# Patient Record
Sex: Female | Born: 2000 | Hispanic: Yes | Marital: Single | Smoking: Never smoker
Health system: Southern US, Community
[De-identification: ages and names within clinical notes are randomized; demographics above are authoritative.]

## PROBLEM LIST (undated history)

## (undated) ENCOUNTER — Inpatient Hospital Stay (HOSPITAL_COMMUNITY): Payer: Self-pay

## (undated) DIAGNOSIS — R011 Cardiac murmur, unspecified: Secondary | ICD-10-CM

## (undated) DIAGNOSIS — N83209 Unspecified ovarian cyst, unspecified side: Secondary | ICD-10-CM

## (undated) DIAGNOSIS — F32A Depression, unspecified: Secondary | ICD-10-CM

## (undated) DIAGNOSIS — R519 Headache, unspecified: Secondary | ICD-10-CM

## (undated) DIAGNOSIS — Z8759 Personal history of other complications of pregnancy, childbirth and the puerperium: Secondary | ICD-10-CM

## (undated) DIAGNOSIS — Z789 Other specified health status: Secondary | ICD-10-CM

## (undated) HISTORY — PX: KNEE SURGERY: SHX244

## (undated) HISTORY — DX: Personal history of other complications of pregnancy, childbirth and the puerperium: Z87.59

---

## 2020-10-28 HISTORY — PX: FEMUR FRACTURE SURGERY: SHX633

## 2020-10-31 HISTORY — PX: KNEE SURGERY: SHX244

## 2020-11-03 HISTORY — PX: DILATION AND CURETTAGE, DIAGNOSTIC / THERAPEUTIC: SUR384

## 2020-12-02 ENCOUNTER — Encounter (HOSPITAL_BASED_OUTPATIENT_CLINIC_OR_DEPARTMENT_OTHER): Payer: Self-pay | Admitting: Nurse Practitioner

## 2020-12-02 ENCOUNTER — Other Ambulatory Visit: Payer: Self-pay

## 2020-12-02 ENCOUNTER — Ambulatory Visit (INDEPENDENT_AMBULATORY_CARE_PROVIDER_SITE_OTHER): Payer: Self-pay | Admitting: Nurse Practitioner

## 2020-12-02 VITALS — BP 107/67 | HR 72 | Temp 97.6°F | Resp 12 | Ht 65.0 in | Wt 168.5 lb

## 2020-12-02 DIAGNOSIS — S82091H Other fracture of right patella, subsequent encounter for open fracture type I or II with delayed healing: Secondary | ICD-10-CM

## 2020-12-02 DIAGNOSIS — Z8759 Personal history of other complications of pregnancy, childbirth and the puerperium: Secondary | ICD-10-CM

## 2020-12-02 DIAGNOSIS — Z789 Other specified health status: Secondary | ICD-10-CM

## 2020-12-02 DIAGNOSIS — F32A Depression, unspecified: Secondary | ICD-10-CM

## 2020-12-02 DIAGNOSIS — Z7689 Persons encountering health services in other specified circumstances: Secondary | ICD-10-CM

## 2020-12-02 DIAGNOSIS — S72301D Unspecified fracture of shaft of right femur, subsequent encounter for closed fracture with routine healing: Secondary | ICD-10-CM

## 2020-12-02 DIAGNOSIS — F419 Anxiety disorder, unspecified: Secondary | ICD-10-CM

## 2020-12-02 DIAGNOSIS — Z603 Acculturation difficulty: Secondary | ICD-10-CM

## 2020-12-02 DIAGNOSIS — Z0000 Encounter for general adult medical examination without abnormal findings: Secondary | ICD-10-CM | POA: Insufficient documentation
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
## 2020-12-02 NOTE — Assessment & Plan Note (Addendum)
Newly immigrated from [place] with food, health, and employment insecurities noted. Significant needs for emotional and physical health also present with recent femur and patellar fracture and miscarriage of twin fetuses.  Appreciate social work help in finding resources for the patient during this transition period.  Notified that patient is not part of [facility] network- in process of finding other services that may help her during this time.

## 2020-12-02 NOTE — Assessment & Plan Note (Signed)
Spanish speaking patient with little to no [place] understanding. Interpreter services utilized today and will be needed for future encounters.

## 2020-12-02 NOTE — Progress Notes (Signed)
Social services and counseling requested

## 2020-12-02 NOTE — Patient Instructions (Addendum)
Recommendations from today's visit: . You will receive a call about the ultrasound and from the orthopedic doctor for your knee.  . You will receive a call from counseling services for your anxiety and depression symptoms . You will receive a call from social work to help with getting you settled.   You can take a medication called FAMOTIDINE (Pepcid) for your stomach. You take this one time a day. This can be purchased over the counter at the pharmacy.  Information on diet, exercise, and health maintenance recommendations are listed below. This is information to help you be sure you are on track for optimal health and monitoring.   Please look over this and let us know if you have any questions or if you have completed any of the health maintenance outside of [place] so that we can be sure your records are up to date.  ___________________________________________________________  Thank you for choosing [facility] for your Primary Care needs. I am excited for the opportunity to partner with you to meet your health care goals. It was a pleasure meeting you today!  I am an Adult-Geriatric Nurse Practitioner with a background in caring for patients for more than 20 years. I received my Paediatric nurse in Nursing and my Doctor of Nursing Practice degrees at [employer]. I received additional fellowship training in primary care and sports medicine after receiving my doctorate degree. I provide primary care and sports medicine services to patients age 71 and older within this office. I am also a provider with the [facility] and the director of the APP Fellowship with [facility].  I am a [place] native, but have called the [place] area home for nearly 20 years and am proud to be a member of this community.   I am passionate about providing the best service to you through preventive medicine and supportive care. I  consider you a part of the medical team and value your input. I work diligently to ensure that you are heard and your needs are met in a safe and effective manner. I want you to feel comfortable with me as your provider and want you to know that your health concerns are important to me.   For your information, our office hours are Monday- Friday 8:00 AM - 5:00 PM At this time I am not in the office on Wednesdays.  If you have questions or concerns, please call our office at [phone] or send [place] a MyChart message and we will respond as quickly as possible.   For all urgent or time sensitive needs we ask that you please call the office to avoid delays. MyChart is not constantly monitored and replies may take up to 72 business hours.  MyChart Policy: . MyChart allows for you to see your visit notes, after visit summary, provider recommendations, lab and tests results, make an appointment, request refills, and contact your provider or the office for non-urgent questions or concerns.  . Providers are seeing patients during normal business hours and do not have built in time to review MyChart messages. We ask that you allow a minimum of 72 business hours for MyChart message responses.  . Complex MyChart concerns may require a visit. Your provider may request you schedule a virtual or in person visit to ensure we are providing the best care possible. . MyChart messages sent after 4:00 PM on Friday will not be received by the provider until  Monday morning.    Lab and Test Results: . You will receive your lab and test results on MyChart as soon as they are completed and results have been sent by the lab or testing facility. Due to this service, you will receive your results BEFORE your provider.  . Please allow a minimum of 72 business hours for your provider to receive and review lab and test results and contact you about.   . Most lab and test result comments from the provider will be sent through  [place]. Your provider may recommend changes to the plan of care, follow-up visits, repeat testing, ask questions, or request an office visit to discuss these results. You may reply directly to this message or call the office at [phone] to provide information for the provider or set up an appointment. . In some instances, you will be called with test results and recommendations. Please let us know if this is preferred and we will make note of this in your chart to provide this for you.    . If you have not heard a response to your lab or test results in 72 business hours, please call the office to let us know.   After Hours: . For all non-emergency after hours needs, please call the office at [phone] and select the option to reach the on-call provider service. On-call services are shared between multiple [facility] offices and therefore it will not be possible to speak directly with your provider. On-call providers may provide medical advice and recommendations, but are unable to provide refills for maintenance medications.  . For all emergency or urgent medical needs after normal business hours, we recommend that you seek care at the closest Urgent Care or Emergency Department to ensure appropriate treatment in a timely manner.  [facility] has a 24 hour emergency room located on the ground floor for your convenience.    Please do not hesitate to reach out to [place] with concerns.   Thank you, again, for choosing me as your health care partner. I appreciate your trust and look forward to learning more about you.   Worthy Keeler, DNP, AGNP-c ___________________________________________________________  Health Maintenance Recommendations Screening Testing  Mammogram  Every 1 -2 years based on history and risk factors  Starting at age 69  Pap Smear  Ages 21-39 every 3 years  Ages 76-65 every 5 years with HPV testing  More frequent testing may be required  based on results and history  Colon Cancer Screening  Every 1-10 years based on test performed, risk factors, and history  Starting at age 44  Bone Density Screening  Every 2-10 years based on history  Starting at age 62 for women  Recommendations for men differ based on medication usage, history, and risk factors  AAA Screening  One time ultrasound  Men 48-37 years old who have every smoked  Lung Cancer Screening  Low Dose Lung CT every 12 months  Age 51-80 years with a 30 pack-year smoking history who still smoke or who have quit within the last 15 years  Screening Labs  Routine  Labs: Complete Blood Count (CBC), Complete Metabolic Panel (CMP), Cholesterol (Lipid Panel)  Every 6-12 months based on history and medications  May be recommended more frequently based on current conditions or previous results  Hemoglobin A1c Lab  Every 3-12 months based on history and previous results  Starting at age 79 or earlier with diagnosis of diabetes, high cholesterol, BMI >26, and/or  risk factors  Frequent monitoring for patients with diabetes to ensure blood sugar control  Thyroid Panel (TSH w/ T3 & T4)  Every 6 months based on history, symptoms, and risk factors  May be repeated more often if on medication  HIV  One time testing for all patients 47 and older  May be repeated more frequently for patients with increased risk factors or exposure  Hepatitis C  One time testing for all patients 58 and older  May be repeated more frequently for patients with increased risk factors or exposure  Gonorrhea, Chlamydia  Every 12 months for all sexually active persons 13-24 years  Additional monitoring may be recommended for those who are considered high risk or who have symptoms  PSA  Men 4-49 years old with risk factors  Additional screening may be recommended from age 33-69 based on risk factors, symptoms, and history  Vaccine Recommendations  Tetanus  Booster  All adults every 10 years  Flu Vaccine  All patients 6 months and older every year  COVID Vaccine  All patients 12 years and older  Initial dosing with booster  May recommend additional booster based on age and health history  HPV Vaccine  2 doses all patients age 28-26  Dosing may be considered for patients over 26  Shingles Vaccine (Shingrix)  2 doses all adults 72 years and older  Pneumonia (Pneumovax 23)  All adults 63 years and older  May recommend earlier dosing based on health history  Pneumonia (Prevnar 12)  All adults 43 years and older  Dosed 1 year after Pneumovax 23  Additional Screening, Testing, and Vaccinations may be recommended on an individualized basis based on family history, health history, risk factors, and/or exposure.  __________________________________________________________  Diet Recommendations for All Patients  I recommend that all patients maintain a diet low in saturated fats, carbohydrates, and cholesterol. While this can be challenging at first, it is not impossible and small changes can make big differences.  Things to try: [place] Kitchen Decreasing the amount of soda, sweet tea, and/or juice to one or less per day and replace with water o While water is always the first choice, if you do not like water you may consider - adding a water additive without sugar to improve the taste - other sugar free drinks . Replace potatoes with a brightly colored vegetable at dinner . Use healthy oils, such as canola oil or olive oil, instead of butter or hard margarine . Limit your bread intake to two pieces or less a day . Replace regular pasta with low carb pasta options . Bake, broil, or grill foods instead of frying . Monitor portion sizes  . Eat smaller, more frequent meals throughout the day instead of large meals  An important thing to remember is, if you love foods that are not great for your health, you don't have to give them up  completely. Instead, allow these foods to be a reward when you have done well. Allowing yourself to still have special treats every once in a while is a nice way to tell yourself thank you for working hard to keep yourself healthy.   Also remember that every day is a new day. If you have a bad day and "fall off the wagon", you can still climb right back up and keep moving along on your journey!  We have resources available to help you!  Some websites that may be helpful include: . www.http://carter.biz/  . Www.VeryWellFit.com _____________________________________________________________  Activity Recommendations for  All Patients  I recommend that all adults get at least 20 minutes of moderate physical activity that elevates your heart rate at least 5 days out of the week.  Some examples include: . Walking or jogging at a pace that allows you to carry on a conversation . Cycling (stationary bike or outdoors) . Water aerobics . Yoga . Weight lifting . Dancing If physical limitations prevent you from putting stress on your joints, exercise in a pool or seated in a chair are excellent options.  Do determine your MAXIMUM heart rate for activity: YOUR AGE - 220 = MAX HeartRate   Remember! . Do not push yourself too hard.  . Start slowly and build up your pace, speed, weight, time in exercise, etc.  . Allow your body to rest between exercise and get good sleep. . You will need more water than normal when you are exerting yourself. Do not wait until you are thirsty to drink. Drink with a purpose of getting in at least 8, 8 ounce glasses of water a day plus more depending on how much you exercise and sweat.    If you begin to develop dizziness, chest pain, abdominal pain, jaw pain, shortness of breath, headache, vision changes, lightheadedness, or other concerning symptoms, stop the activity and allow your body to rest. If your symptoms are severe, seek emergency evaluation immediately. If your  symptoms are concerning, but not severe, please let us know so that we can recommend further evaluation.   ________________________________________________________________

## 2020-12-02 NOTE — Assessment & Plan Note (Addendum)
Review of medical records from [place], [place]-  Reported miscarriage on 10/07/2020 with oral and vaginal medication taken in [place] to facilitate expelling retained products of conception- no f/u at that time Per medical records, HCG levels were found to be significantly elevated on 10/26/2020 Subsequent [place] 10/28/2020 revealed the presence of single gestational sac and 2 egg sacs with two small fetal poles with no fetal heart tones present consistent with failed Erol Flanagin pregnancy. Subsequent [place] 11/01/2020 revealed gestational sac no longer present with evidence suggestive of retained products of conception. Misprostol given and subsequent D&C performed 11/03/2020.  Per note- recommendations for follow-up HCG in 1 week.  Discrepancy between patients understanding and the recommendations. [place] ordered today for TV US based on patients perception of instructions.  Will notify patient to come in or repeat HCG testing.

## 2020-12-02 NOTE — Assessment & Plan Note (Addendum)
New patient to establish care Newly immigrated from [place] No medical history per patient- records from recent hospitalization reviewed- no other records available. Social work consult placed- will need help identifying resources available
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
## 2020-12-16 ENCOUNTER — Ambulatory Visit (HOSPITAL_BASED_OUTPATIENT_CLINIC_OR_DEPARTMENT_OTHER)
Admission: RE | Admit: 2020-12-16 | Discharge: 2020-12-16 | Disposition: A | Discharge status: Home / Self Care | Payer: Self-pay | Source: Ambulatory Visit | Attending: Nurse Practitioner | Admitting: Nurse Practitioner

## 2020-12-16 ENCOUNTER — Encounter: Payer: Self-pay | Admitting: Orthopedic Surgery

## 2020-12-16 ENCOUNTER — Other Ambulatory Visit: Payer: Self-pay

## 2020-12-16 ENCOUNTER — Ambulatory Visit: Payer: Self-pay

## 2020-12-16 ENCOUNTER — Ambulatory Visit (INDEPENDENT_AMBULATORY_CARE_PROVIDER_SITE_OTHER): Payer: Self-pay | Admitting: Orthopedic Surgery

## 2020-12-16 DIAGNOSIS — Z8759 Personal history of other complications of pregnancy, childbirth and the puerperium: Secondary | ICD-10-CM | POA: Insufficient documentation

## 2020-12-16 DIAGNOSIS — M25561 Pain in right knee: Secondary | ICD-10-CM

## 2020-12-16 NOTE — Progress Notes (Signed)
Post-Op Visit Note   Patient: Valerie Erickson           Date of Birth: April 10, 2001           MRN: [id number] Visit Date: 12/16/2020 PCP: Tollie Eth, NP   Assessment & Plan:  Chief Complaint:  Chief Complaint  Patient presents with   Right Knee - Injury   Visit Diagnoses:  1. Right knee pain, unspecified chronicity     Plan: Patient is a 20 year old female who presents s/p right patellar fracture ORIF with intramedullary nail of right femur for right femur fracture on 10/31/2020.  She is a resident of [place] but she was in [place] when she fell off of the wall and had to have surgery.  She was told to remain nonweightbearing and in knee immobilizer until she followed up with a provider in [place].  She currently is not working though she did move to [place] because she wants to find work.  She is living with her uncle aunt and cousin.  Her cousin is bilingual.  She has been nonweightbearing since her surgery date in early May.  Her dressings are still intact from procedure.  These dressings were broken down and removed.  Incision looks to be well-healed and staples were removed.  No sign of infection or dehiscence of the multiple incisions.  Okay to shower.  Radiographs were taken today that show hardware in good position with some early callus formation of the femur fracture.  There is some comminution of the inferior pole of the patella that looks to be potentially struggling to consolidate.  On exam she has incisions that are healing well.  No calf tenderness.  Negative Homans' sign.  She has no knee effusion.  ACL is stable on Lachman exam.  MCL and LCL are stable with no ligamentous laxity to varus/valgus stress.  She is able to perform straight leg raise, initially with about 20 to 30 degree extensor lag.  However after a little bit of extra effort, she is able to actively extend her knee to 0 degrees.  She does endorse pain at the inferior pole of the patella with range of  motion.  She extends to 0 degrees.  1 to 2 cm quadricep atrophy of the right lower extremity compared to the left.  Plan to transition from knee immobilizer to Bledsoe brace.  Prescription given with instructions to lock brace from 0 to 30 degrees.  She will start physical therapy upstairs to work on knee range of motion and especially quad strength.  She will start weightbearing as tolerated with crutches and the knee immobilizer/Bledsoe brace.  She was observed ambulating and seems to be adjusting to getting around with weightbearing okay.  In the meantime, before she starts physical therapy she will start straight leg raises with the knee immobilizer on.  Needs to work on quad strength before she is able to ambulate without crutches. Follow-up in 2 weeks for clinical re-check with Dr. August Saucer and with new x-ray check.   Follow-Up Instructions: No follow-ups on file.   Orders:  Orders Placed This Encounter  Procedures   XR Knee 1-2 Views Right   Ambulatory referral to Physical Therapy   No orders of the defined types were placed in this encounter.   Imaging: US Pelvic Complete With Transvaginal  Result Date: 12/16/2020 CLINICAL DATA:  Abortive treatment for pregnancy in May, confirming passage of products of conception and exclusion of retained products; LMP 12/02/2020 EXAM: TRANSABDOMINAL AND TRANSVAGINAL ULTRASOUND  OF PELVIS TECHNIQUE: Both transabdominal and transvaginal ultrasound examinations of the pelvis were performed. Transabdominal technique was performed for global imaging of the pelvis including uterus, ovaries, adnexal regions, and pelvic cul-de-sac. It was necessary to proceed with endovaginal exam following the transabdominal exam to visualize the uterus, endometrium, and ovaries. COMPARISON:  None FINDINGS: Uterus Measurements: 5.9 x 4.0 x 4.7 cm = volume: 58 mL. Retroverted. Normal morphology without mass Endometrium Thickness: 5 mm. No endometrial fluid, focal abnormality, or  evidence of retained products of conception. Right ovary Measurements: 2.7 x 1.2 x 3.2 cm = volume: 5.5 mL. Normal morphology without mass Left ovary Measurements: 3.1 x 1.8 x 1.9 cm = volume: 5.4 mL. Normal morphology without mass Other findings No free pelvic fluid.  No adnexal masses. IMPRESSION: Normal exam. Electronically Signed   By: Ulyses Southward M.D.   On: 12/16/2020 16:59    PMFS History: Patient Active Problem List   Diagnosis Date Noted   Encounter to establish care 12/02/2020   History of spontaneous abortion 12/02/2020   Anxiety and depression 12/02/2020   Patellar sleeve fracture of right knee, open type I or II, with delayed healing, subsequent encounter 12/02/2020   Need for follow-up by social worker 12/02/2020   Immigrant with language difficulty 12/02/2020   Closed fracture of shaft of right femur with routine healing 12/02/2020   Past Medical History:  Diagnosis Date   H/O one miscarriage     Family History  Problem Relation Age of Onset   Diabetes Father    Hypertension Father     Past Surgical History:  Procedure Laterality Date   DILATION AND CURETTAGE, DIAGNOSTIC / THERAPEUTIC  11/03/2020   FEMUR FRACTURE SURGERY Right 10/28/2020   KNEE SURGERY Right 10/31/2020   Social History   Occupational History   Not on file  Tobacco Use   Smoking status: Never   Smokeless tobacco: Never  Vaping Use   Vaping Use: Never used  Substance and Sexual Activity   Alcohol use: Never   Drug use: Never   Sexual activity: Not Currently    Birth control/protection: None

## 2020-12-16 NOTE — Progress Notes (Signed)
Please call patient- Interpreter services will be needed  Ultrasound shows a normal uterus and ovaries with no evidence of any tissue or retained pregnancy.  No need for any further testing at this time.

## 2020-12-24 ENCOUNTER — Telehealth (HOSPITAL_BASED_OUTPATIENT_CLINIC_OR_DEPARTMENT_OTHER): Payer: Self-pay

## 2020-12-24 NOTE — Telephone Encounter (Signed)
Left patient a message (using [employer] [phone]) to call back to discuss lab results.

## 2020-12-24 NOTE — Telephone Encounter (Signed)
-----   Message from Tollie Eth, NP sent at 12/16/2020  7:43 PM EDT ----- Please call patient- Interpreter services will be needed  Ultrasound shows a normal uterus and ovaries with no evidence of any tissue or retained pregnancy.  No need for any further testing at this time.

## 2020-12-31 ENCOUNTER — Telehealth (HOSPITAL_BASED_OUTPATIENT_CLINIC_OR_DEPARTMENT_OTHER): Payer: Self-pay

## 2020-12-31 NOTE — Telephone Encounter (Signed)
Called [employer] to interpret [place] [phone]).  Called and spoke to [facility] on file [address]  Made her aware of results and she will inform Dayja.  Instructed her to contact the office with questions or concerns.

## 2020-12-31 NOTE — Telephone Encounter (Signed)
-----   Message from Sara E Early, NP sent at 12/16/2020  7:43 PM EDT ----- Please call patient- Interpreter services will be needed  Ultrasound shows a normal uterus and ovaries with no evidence of any tissue or retained pregnancy.  No need for any further testing at this time.  

## 2021-01-01 ENCOUNTER — Encounter: Payer: Self-pay | Admitting: Orthopedic Surgery

## 2021-01-01 ENCOUNTER — Ambulatory Visit (INDEPENDENT_AMBULATORY_CARE_PROVIDER_SITE_OTHER): Payer: Self-pay | Admitting: Orthopedic Surgery

## 2021-01-01 ENCOUNTER — Ambulatory Visit: Payer: Self-pay

## 2021-01-01 DIAGNOSIS — M25561 Pain in right knee: Secondary | ICD-10-CM

## 2021-01-01 NOTE — Progress Notes (Signed)
Office Visit Note   Patient: Valerie Erickson           Date of Birth: 2000/12/10           MRN: [id number] Visit Date: 01/01/2021 Requested by: Tollie Eth, NP [address] [place],  [place] [address] PCP: Tollie Eth, NP  Subjective: Chief Complaint  Patient presents with   Right Knee - Follow-up    HPI: Patient presents with a 72-month out right patella open reduction internal fixation.  She has been doing partial weightbearing in a Bledsoe brace 0 to 30 degrees.                ROS: All systems reviewed are negative as they relate to the chief complaint within the history of present illness.  Patient denies  fevers or chills.   Assessment & Plan: Visit Diagnoses:  1. Right knee pain, unspecified chronicity     Plan: Impression is healed femur fracture with healing patella fracture with comminution of the inferior pole.  Plan is 0-45 and the bedside weightbearing as tolerated without crutches.  2-week return to increase the Bledsoe brace to 60 degrees at that time.  Flat ground only weightbearing with no stairs. Follow-Up Instructions: Return in about 2 weeks (around 01/15/2021).   Orders:  Orders Placed This Encounter  Procedures   XR FEMUR, MIN 2 VIEWS RIGHT   No orders of the defined types were placed in this encounter.     Procedures: No procedures performed   Clinical Data: No additional findings.  Objective: Vital Signs: LMP  (LMP Unknown) Comment: Recent D&C  Physical Exam:   Constitutional: Patient appears well-developed HEENT:  Head: Normocephalic Eyes:EOM are normal Neck: Normal range of motion Cardiovascular: Normal rate Pulmonary/chest: Effort normal Neurologic: Patient is alert Skin: Skin is warm Psychiatric: Patient has normal mood and affect   Ortho Exam: Ortho exam demonstrates ability to do 10 straight leg raises.  Extensor mechanism feels intact.  Incision intact.  No knee effusion.  No calf tenderness negative  Homans.  Range of motion is to about 60 degrees passively.  20 degree extensor lag but she can get the leg straight  Specialty Comments:  No specialty comments available.  Imaging: No results found.   PMFS History: Patient Active Problem List   Diagnosis Date Noted   Encounter to establish care 12/02/2020   History of spontaneous abortion 12/02/2020   Anxiety and depression 12/02/2020   Patellar sleeve fracture of right knee, open type I or II, with delayed healing, subsequent encounter 12/02/2020   Need for follow-up by social worker 12/02/2020   Immigrant with language difficulty 12/02/2020   Closed fracture of shaft of right femur with routine healing 12/02/2020   Past Medical History:  Diagnosis Date   H/O one miscarriage     Family History  Problem Relation Age of Onset   Diabetes Father    Hypertension Father     Past Surgical History:  Procedure Laterality Date   DILATION AND CURETTAGE, DIAGNOSTIC / THERAPEUTIC  11/03/2020   FEMUR FRACTURE SURGERY Right 10/28/2020   KNEE SURGERY Right 10/31/2020   Social History   Occupational History   Not on file  Tobacco Use   Smoking status: Never   Smokeless tobacco: Never  Vaping Use   Vaping Use: Never used  Substance and Sexual Activity   Alcohol use: Never   Drug use: Never   Sexual activity: Not Currently    Birth control/protection: None

## 2021-01-20 ENCOUNTER — Other Ambulatory Visit: Payer: Self-pay

## 2021-01-20 ENCOUNTER — Ambulatory Visit (INDEPENDENT_AMBULATORY_CARE_PROVIDER_SITE_OTHER): Payer: Self-pay | Admitting: Orthopedic Surgery

## 2021-01-20 DIAGNOSIS — M25561 Pain in right knee: Secondary | ICD-10-CM

## 2021-01-20 DIAGNOSIS — R0602 Shortness of breath: Secondary | ICD-10-CM

## 2021-01-20 NOTE — Progress Notes (Signed)
Post-Op Visit Note   Patient: Valerie Erickson           Date of Birth: 02/02/2001           MRN: [id number] Visit Date: 01/20/2021 PCP: Tollie Eth, NP   Assessment & Plan:  Chief Complaint:  Chief Complaint  Patient presents with   Right Knee - Follow-up   Visit Diagnoses:  1. Right knee pain, unspecified chronicity   2. Shortness of breath     Plan: Patient is a 20 year old female who returns s/p right patellar fracture ORIF with intramedullary nail of right femur for right femur fracture on 10/31/2020 in [place].  She is currently ambulating full weightbearing with Bledsoe brace and she has discontinued crutches as she states she feels a lot more stable when walking.  She is doing physical therapy exercises at home.  She has Bledsoe brace that is set from 0 to 45 degrees.  She states that her leg feels like it is getting stronger and stronger and she has less difficulty lifting her leg that she has in the past.  On exam today, incision over the anterior knee is healing well with no evidence of infection or dehiscence.  She has no effusion.  She has range of motion from 0 to 70 degrees passively.  Moderate calf tenderness distally with weakly positive Homans' sign.  She is able to perform straight leg raise with about 15 degree extensor lag.  She is able to maintain her leg completely straight at 0 degrees when the leg is placed at 0 degrees and support is taken away from the heel.  She does have continued tenderness over the inferior portion of the patella.  She slipped yesterday and had increased pain but today her pain is back to baseline.  One of her main complaint today is endorsement of 1 week of shortness of breath without chest pain.  She states that while talking on the phone she starts "running out of air".  She likens it to heavy exercise but after taking a break, she returned back to normal.  She has never had any symptoms like this before.  She denies any family history of  DVT/PE though she is unaware what a blood clot is exactly.  She has heart rate of 88 on examination.  Wells criteria gives a score of 6 suggesting fairly high likelihood of pulmonary embolus.  Plan to order CTA of chest for further evaluation with ultrasound of lower extremities to rule out DVT.  Follow-Up Instructions: No follow-ups on file.   Orders:  Orders Placed This Encounter  Procedures   CT Angio Chest Pulmonary Embolism (PE) W or WO Contrast   VAS [place] LOWER EXTREMITY VENOUS (DVT)   No orders of the defined types were placed in this encounter.   Imaging: No results found.  PMFS History: Patient Active Problem List   Diagnosis Date Noted   Encounter to establish care 12/02/2020   History of spontaneous abortion 12/02/2020   Anxiety and depression 12/02/2020   Patellar sleeve fracture of right knee, open type I or II, with delayed healing, subsequent encounter 12/02/2020   Need for follow-up by social worker 12/02/2020   Immigrant with language difficulty 12/02/2020   Closed fracture of shaft of right femur with routine healing 12/02/2020   Past Medical History:  Diagnosis Date   H/O one miscarriage     Family History  Problem Relation Age of Onset   Diabetes Father    Hypertension Father  Past Surgical History:  Procedure Laterality Date   DILATION AND CURETTAGE, DIAGNOSTIC / THERAPEUTIC  11/03/2020   FEMUR FRACTURE SURGERY Right 10/28/2020   KNEE SURGERY Right 10/31/2020   Social History   Occupational History   Not on file  Tobacco Use   Smoking status: Never   Smokeless tobacco: Never  Vaping Use   Vaping Use: Never used  Substance and Sexual Activity   Alcohol use: Never   Drug use: Never   Sexual activity: Not Currently    Birth control/protection: None

## 2021-01-21 ENCOUNTER — Ambulatory Visit (HOSPITAL_COMMUNITY): Admission: RE | Admit: 2021-01-21 | Payer: Self-pay | Source: Ambulatory Visit

## 2021-01-22 ENCOUNTER — Encounter (HOSPITAL_BASED_OUTPATIENT_CLINIC_OR_DEPARTMENT_OTHER): Payer: Self-pay

## 2021-01-22 ENCOUNTER — Other Ambulatory Visit: Payer: Self-pay

## 2021-01-22 ENCOUNTER — Ambulatory Visit (HOSPITAL_BASED_OUTPATIENT_CLINIC_OR_DEPARTMENT_OTHER)
Admission: RE | Admit: 2021-01-22 | Discharge: 2021-01-22 | Disposition: A | Discharge status: Home / Self Care | Payer: Self-pay | Source: Ambulatory Visit | Attending: Orthopedic Surgery | Admitting: Orthopedic Surgery

## 2021-01-22 DIAGNOSIS — R0602 Shortness of breath: Secondary | ICD-10-CM | POA: Insufficient documentation

## 2021-01-22 MED ORDER — IOHEXOL 350 MG/ML SOLN
75.0000 mL | Freq: Once | INTRAVENOUS | Status: AC | PRN
  Administered 2021-01-22: 75 mL via INTRAVENOUS

## 2021-01-23 ENCOUNTER — Encounter: Payer: Self-pay | Admitting: Orthopedic Surgery

## 2021-02-03 ENCOUNTER — Other Ambulatory Visit: Payer: Self-pay

## 2021-02-03 ENCOUNTER — Ambulatory Visit (INDEPENDENT_AMBULATORY_CARE_PROVIDER_SITE_OTHER): Payer: Self-pay

## 2021-02-03 ENCOUNTER — Ambulatory Visit (INDEPENDENT_AMBULATORY_CARE_PROVIDER_SITE_OTHER): Payer: Self-pay | Admitting: Nurse Practitioner

## 2021-02-03 ENCOUNTER — Other Ambulatory Visit (HOSPITAL_BASED_OUTPATIENT_CLINIC_OR_DEPARTMENT_OTHER): Payer: Self-pay

## 2021-02-03 ENCOUNTER — Ambulatory Visit (INDEPENDENT_AMBULATORY_CARE_PROVIDER_SITE_OTHER): Payer: Self-pay | Admitting: Orthopedic Surgery

## 2021-02-03 ENCOUNTER — Encounter (HOSPITAL_BASED_OUTPATIENT_CLINIC_OR_DEPARTMENT_OTHER): Payer: Self-pay | Admitting: Nurse Practitioner

## 2021-02-03 VITALS — BP 109/64 | HR 75 | Ht 65.0 in | Wt 178.4 lb

## 2021-02-03 DIAGNOSIS — F419 Anxiety disorder, unspecified: Secondary | ICD-10-CM

## 2021-02-03 DIAGNOSIS — F32A Depression, unspecified: Secondary | ICD-10-CM

## 2021-02-03 DIAGNOSIS — Z0000 Encounter for general adult medical examination without abnormal findings: Secondary | ICD-10-CM

## 2021-02-03 DIAGNOSIS — S82091H Other fracture of right patella, subsequent encounter for open fracture type I or II with delayed healing: Secondary | ICD-10-CM

## 2021-02-03 DIAGNOSIS — M79604 Pain in right leg: Secondary | ICD-10-CM

## 2021-02-03 DIAGNOSIS — F411 Generalized anxiety disorder: Secondary | ICD-10-CM

## 2021-02-03 DIAGNOSIS — K219 Gastro-esophageal reflux disease without esophagitis: Secondary | ICD-10-CM

## 2021-02-03 DIAGNOSIS — R0602 Shortness of breath: Secondary | ICD-10-CM | POA: Insufficient documentation

## 2021-02-03 MED ORDER — PANTOPRAZOLE SODIUM 40 MG PO TBEC
40.0000 mg | DELAYED_RELEASE_TABLET | Freq: Every day | ORAL | 3 refills | Status: DC
  Filled 2021-02-03: qty 30, 30d supply, fill #0

## 2021-02-03 NOTE — Assessment & Plan Note (Signed)
Symptoms appear to be provoked by anxiety- which is understandable given all that she has been through over the past several months.  CT ordered by ortho shows no sign of PE or cardiopulmonary etiology- reviewed in office today Discussed medication and counseling options  She would like to avoid medications at this time, but is open to counseling. Referral placed for counseling with request for spanish speaking provider  Will check labs today to ensure that no signs of anemia or other concerning factors are present that could worsen symptoms. Appreciate counseling input on medication and further treatment options

## 2021-02-03 NOTE — Assessment & Plan Note (Signed)
CPE today No pap today, but will plan in one year Labs today Mood a concerning finding, otherwise appears healthy Recommend follow-up in 1 year for CPE or sooner if needed

## 2021-02-03 NOTE — Patient Instructions (Addendum)
Revisaremos su sangre para observar sus recuentos de hierro en su sangre y asegurarnos de que su tiroides est funcionando correctamente. Ambos pueden causar algunos de los sntomas de dificultad para respirar, fatiga y ansiedad que est teniendo. We will check your blood to look at your iron counts in your blood and make sure that your thyroid is working properly. Both of these can cause some of the shortness of breath, fatigue, and anxiety symptoms that you are having.   La tomografa computarizada no mostr ningn problema con los pulmones ni ningn signo de un cogulo de [place], por lo que es una buena noticia. The CT scan did not show any problems with your lungs or any signs of a blood clot, so that is good news.   Cuando ha estado levantado y moviendo la rodilla puede hincharse y doler ms. Puede poner una bolsa de hielo en la rodilla durante 20 minutos para ayudar con el dolor y la hinchazn.  When you have been up and moving your knee may swell and hurt more. You can put an ice pack on the knee for 20 minutes to help with the pain and swelling.   He enviado una referencia a un consejero para hablar con usted [employer] de humor que est teniendo. Esto es muy comn con Neomia Dear transicin y Neomia Dear lesin tan grandes: has pasado por mucho. Creo que esto ser til.  I have sent a referral to a counselor to talk with you about the mood changes you are having. This is very common with such a big transition and injury- you have been through a lot. I think this will be helpful.   He enviado un medicamento llamado pantoprazol a la farmacia de abajo para ayudar con el dolor que tiene en el [place]. Este dolor se debe a la irritacin y Financial risk analyst cuando se [place] ibuprofeno para Chief Technology Officer durante un largo perodo de [place]. I have sent a medicine called pantoprazole to the pharmacy downstairs to help with the pain you are having in your stomach. This pain is from irritation and can get worse when using  ibuprofen for pain for a long period of time.

## 2021-02-03 NOTE — Assessment & Plan Note (Signed)
Increased ibuprofen use with recent injury Tenderness present to epigastric region with endorsement of "burning" since starting ibuprofen Given the nature of presentation- recommend pantoprazole to help with suspected GERD symptoms, especially while taking ibuprofen.  No alarm sx present today.  F/U if sx worsen or fail to improve.
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
## 2021-02-03 NOTE — Assessment & Plan Note (Signed)
See anxiety

## 2021-02-03 NOTE — Assessment & Plan Note (Signed)
Positive anxiety screening with symptoms of palpitations and shortness of breath reported Will obtain labs today to determine if other etiology could be exacerbating her condition Very likely the significant life changes and events in the past several months have triggered her symptoms. Referral to counseling with spanish speaking provider requested She is not interested in medication at this time Will follow for further recommendations

## 2021-02-03 NOTE — Assessment & Plan Note (Signed)
Continues with ortho and therapy Improved symptoms and ROM present Recommend ice 20 minutes at a time 3 times a day for swelling with increased activity F/U with ortho if sx worsen or fail to improve.

## 2021-02-04 LAB — CBC WITH DIFFERENTIAL/PLATELET
Basophils Absolute: 0 10*3/uL (ref 0.0–0.2)
Basos: 1 %
EOS (ABSOLUTE): 0.2 10*3/uL (ref 0.0–0.4)
Eos: 2 %
Hematocrit: 43.9 % (ref 34.0–46.6)
Hemoglobin: 14.7 g/dL (ref 11.1–15.9)
Immature Grans (Abs): 0.1 10*3/uL (ref 0.0–0.1)
Immature Granulocytes: 1 %
Lymphocytes Absolute: 1.5 10*3/uL (ref 0.7–3.1)
Lymphs: 18 %
MCH: 30.8 pg (ref 26.6–33.0)
MCHC: 33.5 g/dL (ref 31.5–35.7)
MCV: 92 fL (ref 79–97)
Monocytes Absolute: 0.6 10*3/uL (ref 0.1–0.9)
Monocytes: 7 %
Neutrophils Absolute: 5.8 10*3/uL (ref 1.4–7.0)
Neutrophils: 71 %
Platelets: 307 10*3/uL (ref 150–450)
RBC: 4.77 x10E6/uL (ref 3.77–5.28)
RDW: 12.7 % (ref 11.7–15.4)
WBC: 8.1 10*3/uL (ref 3.4–10.8)

## 2021-02-04 LAB — COMPREHENSIVE METABOLIC PANEL
ALT: 32 IU/L (ref 0–32)
AST: 21 IU/L (ref 0–40)
Albumin/Globulin Ratio: 1.7 (ref 1.2–2.2)
Albumin: 4.6 g/dL (ref 3.9–5.0)
Alkaline Phosphatase: 117 IU/L — ABNORMAL HIGH (ref 42–106)
BUN/Creatinine Ratio: 21 (ref 9–23)
BUN: 10 mg/dL (ref 6–20)
Bilirubin Total: 0.2 mg/dL (ref 0.0–1.2)
CO2: 21 mmol/L (ref 20–29)
Calcium: 9.5 mg/dL (ref 8.7–10.2)
Chloride: 105 mmol/L (ref 96–106)
Creatinine, Ser: 0.48 mg/dL — ABNORMAL LOW (ref 0.57–1.00)
Globulin, Total: 2.7 g/dL (ref 1.5–4.5)
Glucose: 94 mg/dL (ref 65–99)
Potassium: 4.4 mmol/L (ref 3.5–5.2)
Sodium: 141 mmol/L (ref 134–144)
Total Protein: 7.3 g/dL (ref 6.0–8.5)
eGFR: 139 mL/min/{1.73_m2} (ref >59)

## 2021-02-04 LAB — TSH: TSH: 2.25 u[IU]/mL (ref 0.450–4.500)

## 2021-02-04 LAB — VITAMIN D 25 HYDROXY (VIT D DEFICIENCY, FRACTURES): Vit D, 25-Hydroxy: 28.1 ng/mL — ABNORMAL LOW (ref 30.0–100.0)

## 2021-02-05 NOTE — Progress Notes (Signed)
Please call patient with translator- Spanish  Labs look good overall. The vitamin D levels are low. I recommend a vitamin D supplement to help with this. Vitamin D3 [phone] iU per day. No other changes or concerns.

## 2021-02-06 ENCOUNTER — Telehealth (HOSPITAL_BASED_OUTPATIENT_CLINIC_OR_DEPARTMENT_OTHER): Payer: Self-pay

## 2021-02-06 NOTE — Telephone Encounter (Signed)
Patient needs interpreter. [employer] [phone]) Attempted to call patient to discuss lab results.   Left voicemail for patient to call back.

## 2021-02-06 NOTE — Telephone Encounter (Signed)
-----   Message from Sara E Early, NP sent at 02/05/2021  8:02 PM EDT ----- Please call patient with translator- Spanish  Labs look good overall. The vitamin D levels are low. I recommend a vitamin D supplement to help with this. Vitamin D3 [phone] iU per day. No other changes or concerns.  

## 2021-02-11 ENCOUNTER — Telehealth (HOSPITAL_BASED_OUTPATIENT_CLINIC_OR_DEPARTMENT_OTHER): Payer: Self-pay

## 2021-02-11 NOTE — Telephone Encounter (Signed)
-----   Message from Tollie Eth, NP sent at 02/05/2021  8:02 PM EDT ----- Please call patient with translator- Spanish  Labs look good overall. The vitamin D levels are low. I recommend a vitamin D supplement to help with this. Vitamin D3 [phone] iU per day. No other changes or concerns.

## 2021-02-11 NOTE — Telephone Encounter (Signed)
Per DPR spoke with patient's cousin to discuss lab results. She is aware and agreeable to plan. She was instructed to contact the office with questions and concerns.

## 2021-02-17 ENCOUNTER — Encounter: Payer: Self-pay | Admitting: Orthopedic Surgery

## 2021-02-17 ENCOUNTER — Ambulatory Visit
Admission: RE | Admit: 2021-02-17 | Discharge: 2021-02-17 | Disposition: A | Discharge status: Home / Self Care | Payer: Self-pay | Source: Ambulatory Visit | Attending: Surgical | Admitting: Surgical

## 2021-02-17 DIAGNOSIS — M79604 Pain in right leg: Secondary | ICD-10-CM

## 2021-02-17 NOTE — Progress Notes (Signed)
   Post-Op Visit Note   Patient: Valerie Erickson           Date of Birth: 2001-02-18           MRN: [id number] Visit Date: 02/03/2021 PCP: Tollie Eth, NP   Assessment & Plan:  Chief Complaint:  Chief Complaint  Patient presents with   Right Leg - Routine Post Op   Visit Diagnoses:  1. Pain in right leg     Plan: Patient is a 20 year old female who is s/p right patella fracture ORIF and right femur intramedullary nail on 11/01/10 at outside facility.  She has been doing well overall and feels like she is progressing until she began to have severe pain as of 2 nights ago without any new injury.  She does find it more difficult to lift her leg and extend her knee but she is able to do this.  She had her vitamin D checked by her primary care provider today which is a good idea to check.  On exam she has 0 degrees extension and 95 degrees of knee flexion.  She actively extends to about 10 degrees.  Able to perform straight leg raise with 10 degree extensor lag.  No effusion noted.  Tenderness diffusely through the anterior right knee but mostly over the patella anteriorly.  No pain with hip range of motion.  Radiographs of the right leg today show continued fragmentation without significantly increased consolidation of the inferior pole of the patella.  Femoral shaft fracture seems to be healing well.  With patient's increased pain as well as the lack of progressive fracture consolidation, plan to order CT of the right knee to evaluate for nonunion.  Follow-Up Instructions: No follow-ups on file.   Orders:  Orders Placed This Encounter  Procedures   XR FEMUR, MIN 2 VIEWS RIGHT   CT KNEE RIGHT WO CONTRAST   No orders of the defined types were placed in this encounter.   Imaging: No results found.  PMFS History: Patient Active Problem List   Diagnosis Date Noted   Shortness of breath 02/03/2021   Generalized anxiety disorder 02/03/2021   Gastroesophageal reflux disease  02/03/2021   Encounter for annual physical exam 12/02/2020   History of spontaneous abortion 12/02/2020   Anxiety and depression 12/02/2020   Patellar sleeve fracture of right knee, open type I or II, with delayed healing, subsequent encounter 12/02/2020   Need for follow-up by social worker 12/02/2020   Immigrant with language difficulty 12/02/2020   Closed fracture of shaft of right femur with routine healing 12/02/2020   Past Medical History:  Diagnosis Date   H/O one miscarriage     Family History  Problem Relation Age of Onset   Diabetes Father    Hypertension Father     Past Surgical History:  Procedure Laterality Date   DILATION AND CURETTAGE, DIAGNOSTIC / THERAPEUTIC  11/03/2020   FEMUR FRACTURE SURGERY Right 10/28/2020   KNEE SURGERY Right 10/31/2020   Social History   Occupational History   Not on file  Tobacco Use   Smoking status: Never   Smokeless tobacco: Never  Vaping Use   Vaping Use: Never used  Substance and Sexual Activity   Alcohol use: Never   Drug use: Never   Sexual activity: Not Currently    Birth control/protection: None

## 2021-04-23 ENCOUNTER — Ambulatory Visit: Payer: No Typology Code available for payment source | Admitting: Orthopedic Surgery

## 2021-08-18 ENCOUNTER — Ambulatory Visit (INDEPENDENT_AMBULATORY_CARE_PROVIDER_SITE_OTHER): Payer: Self-pay

## 2021-08-18 ENCOUNTER — Encounter: Payer: Self-pay | Admitting: Orthopedic Surgery

## 2021-08-18 ENCOUNTER — Other Ambulatory Visit: Payer: Self-pay

## 2021-08-18 ENCOUNTER — Ambulatory Visit (INDEPENDENT_AMBULATORY_CARE_PROVIDER_SITE_OTHER): Payer: Self-pay | Admitting: Orthopedic Surgery

## 2021-08-18 DIAGNOSIS — M25561 Pain in right knee: Secondary | ICD-10-CM

## 2021-08-18 NOTE — Progress Notes (Signed)
Office Visit Note   Patient: Valerie Erickson           Date of Birth: [birth year]           MRN: [id number] Visit Date: 08/18/2021 Requested by: Tollie Eth, NP [id number] [place],  [place] [address] PCP: Tollie Eth, NP  Subjective: Chief Complaint  Patient presents with   Right Knee - Follow-up    HPI: Patient presents for evaluation of the right knee.  She fell while she was cleaning last week.  She is wearing a sleeve.  Has a history of femur fracture fixation and patella fracture fixation for open fracture done last year.  She is concerned about hardware failure.  This was a hyperflexion type injury and impact type injury.  She has been taking some over-the-counter medication.  Currently not employed and not in school.  Reports some catching sensations.              ROS: All systems reviewed are negative as they relate to the chief complaint within the history of present illness.  Patient denies  fevers or chills.   Assessment & Plan: Visit Diagnoses:  1. Right knee pain, unspecified chronicity     Plan: Impression is right knee pain with no change in hardware position.  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  Could consider operative removal of that inferior pole with reimplantation of the patellar tendon.  Nonetheless I do not think it is bothering her that much for that type of intervention.  Do not see really any change in hardware position and that is primarily what she was trying to determine.  She will follow-up with [place] as needed.  Follow-Up Instructions: No follow-ups on file.   Orders:  Orders Placed This Encounter  Procedures   XR KNEE 3 VIEW RIGHT   No orders of the defined types were placed in this encounter.     Procedures: No procedures performed   Clinical Data: No additional findings.  Objective: Vital Signs: There were no vitals taken for this visit.  Physical Exam:    Constitutional: Patient appears well-developed HEENT:  Head: Normocephalic Eyes:EOM are normal Neck: Normal range of motion Cardiovascular: Normal rate Pulmonary/chest: Effort normal Neurologic: Patient is alert Skin: Skin is warm Psychiatric: Patient has normal mood and affect   Ortho Exam: Ortho exam demonstrates slightly antalgic gait to the right.  No effusion in the right knee.  Her extensor mechanism is functional but somewhat tender particular at the inferior pole of the patella.  No change in expected amount of patellofemoral crepitus present.  Incision intact.  Specialty Comments:  No specialty comments available.  Imaging: No results found.   PMFS History: Patient Active Problem List   Diagnosis Date Noted   Shortness of breath 02/03/2021   Generalized anxiety disorder 02/03/2021   Gastroesophageal reflux disease 02/03/2021   Encounter for annual physical exam 12/02/2020   History of spontaneous abortion 12/02/2020   Anxiety and depression 12/02/2020   Patellar sleeve fracture of right knee, open type I or II, with delayed healing, subsequent encounter 12/02/2020   Need for follow-up by social worker 12/02/2020   Immigrant with language difficulty 12/02/2020   Closed fracture of shaft of right femur with routine healing 12/02/2020   Past Medical History:  Diagnosis Date   H/O one miscarriage     Family History  Problem Relation Age of Onset   Diabetes Father  Hypertension Father     Past Surgical History:  Procedure Laterality Date   DILATION AND CURETTAGE, DIAGNOSTIC / THERAPEUTIC  11/03/2020   FEMUR FRACTURE SURGERY Right 10/28/2020   KNEE SURGERY Right 10/31/2020   Social History   Occupational History   Not on file  Tobacco Use   Smoking status: Never   Smokeless tobacco: Never  Vaping Use   Vaping Use: Never used  Substance and Sexual Activity   Alcohol use: Never   Drug use: Never   Sexual activity: Not Currently    Birth  control/protection: None

## 2021-10-14 IMAGING — CT CT ANGIO CHEST
3 of 8 series · 13 of 36 positions shown · IV contrast (omnipaque)
Comparison: None.

CLINICAL DATA: Shortness of breath post knee surgery.

EXAM:
CT ANGIOGRAPHY CHEST WITH CONTRAST
TECHNIQUE: Multidetector CT imaging of the chest was performed using the
standard protocol during bolus administration of intravenous
contrast. Multiplanar CT image reconstructions and MIPs were
obtained to evaluate the vascular anatomy.
CONTRAST:  75mL OMNIPAQUE IOHEXOL 350 MG/ML SOLN

[Series 4: pe soft · axial · 0.69mm/px · z∈[+1334,+1474]mm · 3 of 140 slices shown]
[im 35/140  lung]
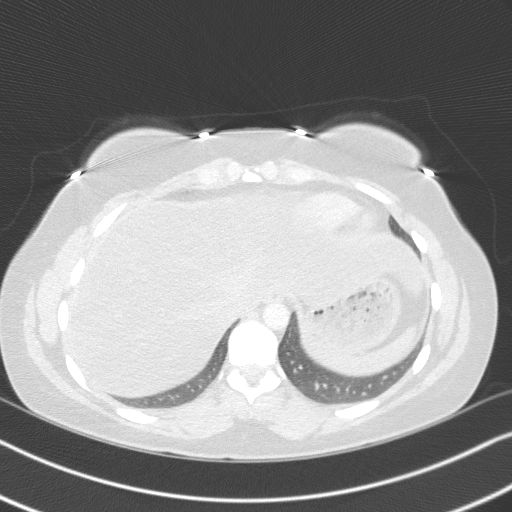
[im 70/140  lung]
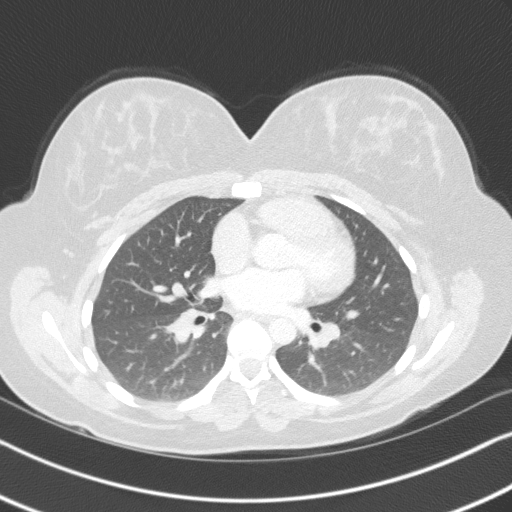
[im 105/140  lung]
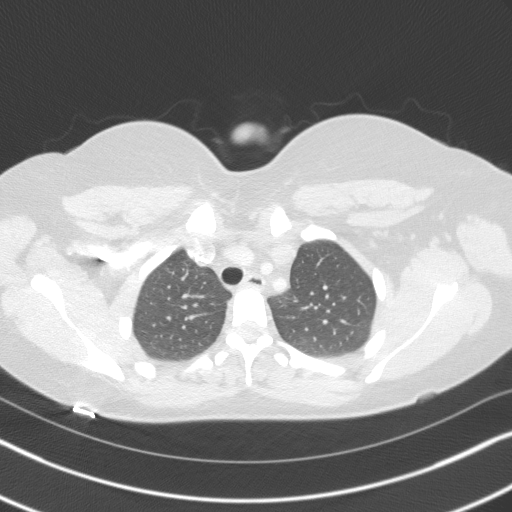

[Series 8: pe axial thins · axial · 0.81mm/px · z∈[+1289,+1505]mm · 9 of 270 slices shown]
[im 27/270  lung]
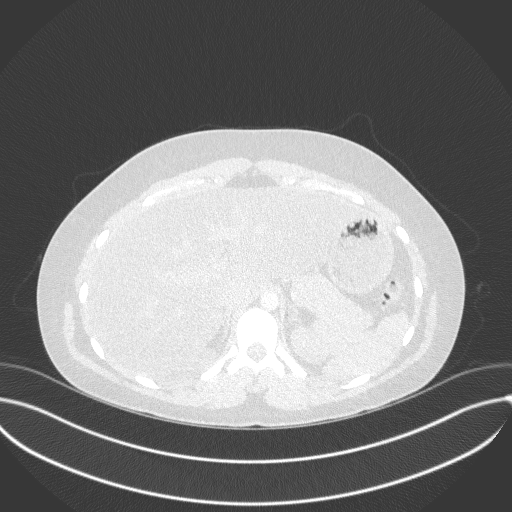
[im 54/270  mediastinal]
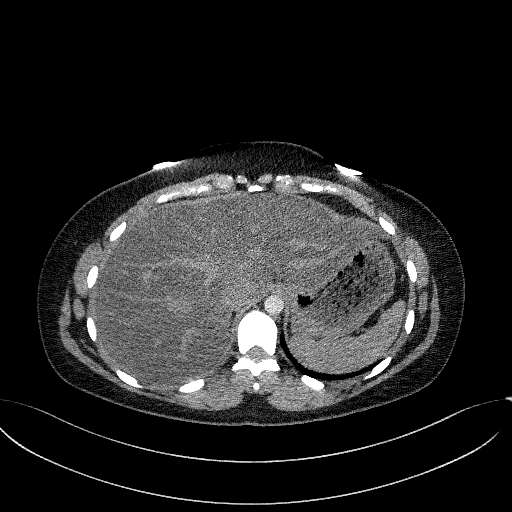
[im 81/270  lung]
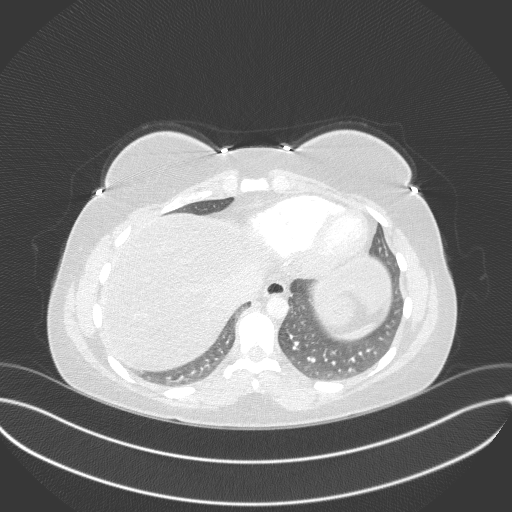
[im 108/270  mediastinal]
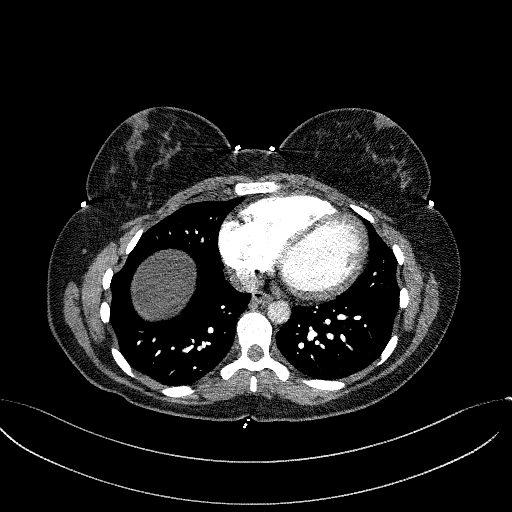
[im 135/270  lung]
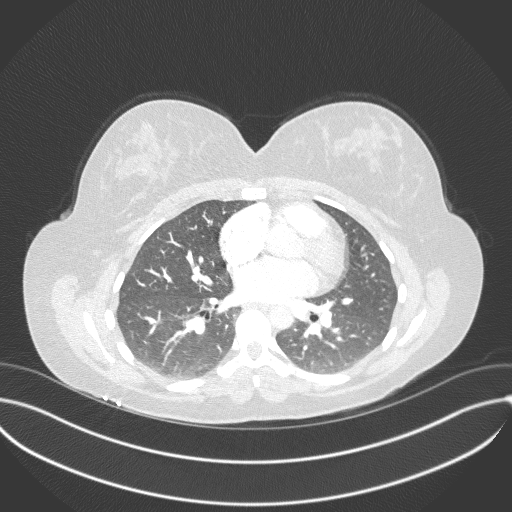
[im 162/270  mediastinal]
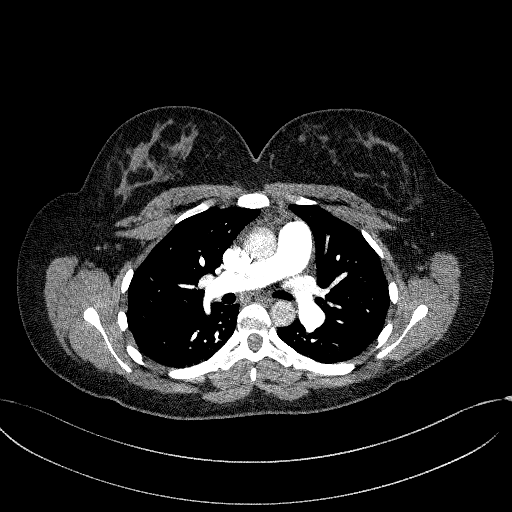
[im 189/270  lung]
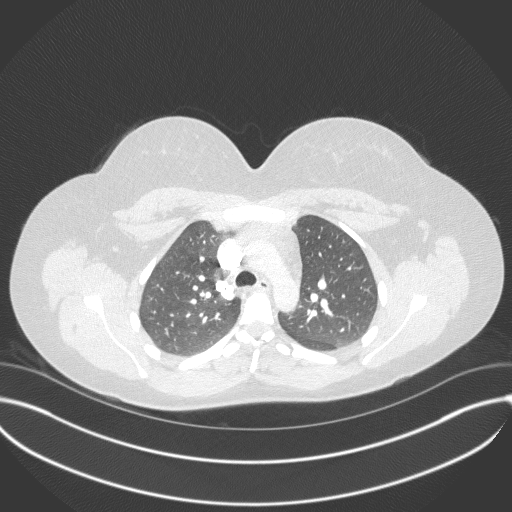
[im 216/270  mediastinal]
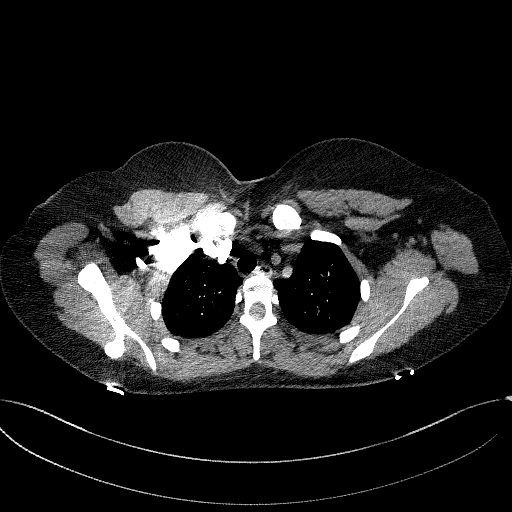
[im 243/270  lung]
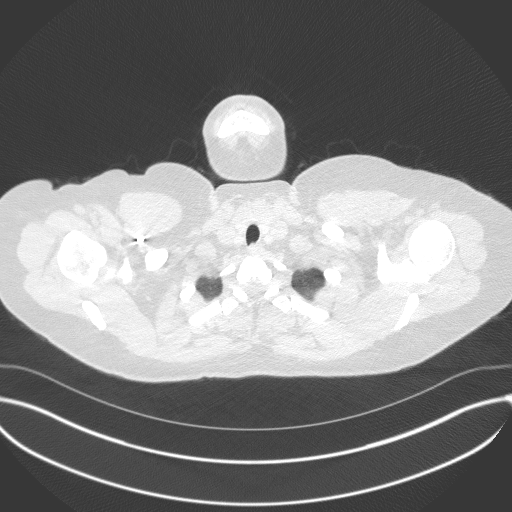

[Series 10: cor soft · coronal · 0.52mm/px · 1 of 139 slices shown]
[im 70/139  mediastinal]
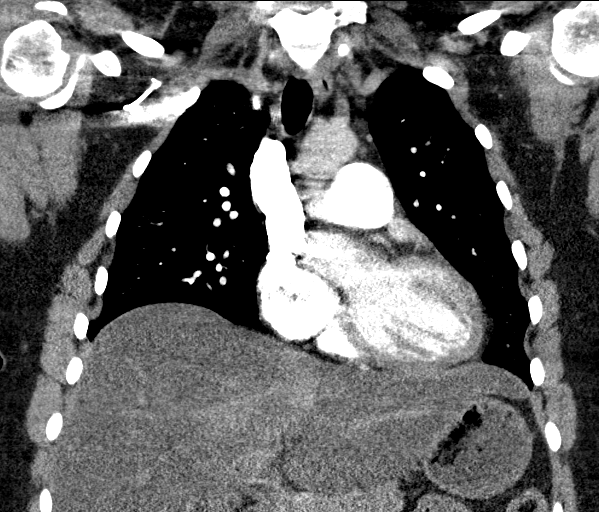

[13 of 36 positions shown; findings below may reference images not displayed]

FINDINGS: Cardiovascular: Heart size normal. No pericardial effusion.
Satisfactory opacification of pulmonary arteries noted, and there is
no evidence of pulmonary emboli. Adequate contrast opacification of
the thoracic aorta with no evidence of dissection, aneurysm, or
stenosis. There is classic 3-vessel brachiocephalic arch anatomy
without proximal stenosis. No significant atheromatous change.

Mediastinum/Nodes: No mass or adenopathy.

Lungs/Pleura: No pleural effusion. No pneumothorax. Lungs are clear.

Upper Abdomen: Fatty liver.  No acute findings.

Musculoskeletal: No chest wall abnormality. No acute or significant
osseous findings.

Review of the MIP images confirms the above findings.
IMPRESSION: 1. Negative for  PE or thoracic aortic dissection.
2. Fatty liver

## 2021-11-07 ENCOUNTER — Inpatient Hospital Stay (HOSPITAL_COMMUNITY)
Admission: EM | Admit: 2021-11-07 | Discharge: 2021-11-07 | Disposition: A | Discharge status: Home / Self Care | Payer: Self-pay | Attending: Obstetrics & Gynecology | Admitting: Obstetrics & Gynecology

## 2021-11-07 ENCOUNTER — Inpatient Hospital Stay (HOSPITAL_COMMUNITY): Payer: Self-pay

## 2021-11-07 ENCOUNTER — Other Ambulatory Visit: Payer: Self-pay

## 2021-11-07 ENCOUNTER — Encounter (HOSPITAL_COMMUNITY): Payer: Self-pay | Admitting: Emergency Medicine

## 2021-11-07 DIAGNOSIS — Z3A01 Less than 8 weeks gestation of pregnancy: Secondary | ICD-10-CM | POA: Insufficient documentation

## 2021-11-07 DIAGNOSIS — R1013 Epigastric pain: Secondary | ICD-10-CM | POA: Insufficient documentation

## 2021-11-07 DIAGNOSIS — O3680X0 Pregnancy with inconclusive fetal viability, not applicable or unspecified: Secondary | ICD-10-CM

## 2021-11-07 DIAGNOSIS — R109 Unspecified abdominal pain: Secondary | ICD-10-CM

## 2021-11-07 DIAGNOSIS — O26891 Other specified pregnancy related conditions, first trimester: Secondary | ICD-10-CM | POA: Insufficient documentation

## 2021-11-07 HISTORY — DX: Other specified health status: Z78.9

## 2021-11-07 LAB — URINALYSIS, ROUTINE W REFLEX MICROSCOPIC
Bacteria, UA: NONE SEEN
Bilirubin Urine: NEGATIVE
Glucose, UA: NEGATIVE mg/dL
Hgb urine dipstick: NEGATIVE
Ketones, ur: NEGATIVE mg/dL
Nitrite: NEGATIVE
Protein, ur: NEGATIVE mg/dL
Specific Gravity, Urine: 1.016 (ref 1.005–1.030)
pH: 7 (ref 5.0–8.0)

## 2021-11-07 LAB — COMPREHENSIVE METABOLIC PANEL
ALT: 28 U/L (ref 0–44)
AST: 23 U/L (ref 15–41)
Albumin: 3.8 g/dL (ref 3.5–5.0)
Alkaline Phosphatase: 52 U/L (ref 38–126)
Anion gap: 5 (ref 5–15)
BUN: 5 mg/dL — ABNORMAL LOW (ref 6–20)
CO2: 20 mmol/L — ABNORMAL LOW (ref 22–32)
Calcium: 8.6 mg/dL — ABNORMAL LOW (ref 8.9–10.3)
Chloride: 110 mmol/L (ref 98–111)
Creatinine, Ser: 0.53 mg/dL (ref 0.44–1.00)
GFR, Estimated: >60 mL/min (ref >60)
Glucose, Bld: 104 mg/dL — ABNORMAL HIGH (ref 70–99)
Potassium: 3.8 mmol/L (ref 3.5–5.1)
Sodium: 135 mmol/L (ref 135–145)
Total Bilirubin: 0.6 mg/dL (ref 0.3–1.2)
Total Protein: 6.8 g/dL (ref 6.5–8.1)

## 2021-11-07 LAB — WET PREP, GENITAL
Sperm: NONE SEEN
Trich, Wet Prep: NONE SEEN
WBC, Wet Prep HPF POC: >=10 — AB (ref <10)
Yeast Wet Prep HPF POC: NONE SEEN

## 2021-11-07 LAB — CBC
HCT: 41.2 % (ref 36.0–46.0)
Hemoglobin: 14.9 g/dL (ref 12.0–15.0)
MCH: 32.5 pg (ref 26.0–34.0)
MCHC: 36.2 g/dL — ABNORMAL HIGH (ref 30.0–36.0)
MCV: 90 fL (ref 80.0–100.0)
Platelets: 220 10*3/uL (ref 150–400)
RBC: 4.58 MIL/uL (ref 3.87–5.11)
RDW: 12.4 % (ref 11.5–15.5)
WBC: 6.1 10*3/uL (ref 4.0–10.5)
nRBC: 0 % (ref 0.0–0.2)

## 2021-11-07 LAB — I-STAT BETA HCG BLOOD, ED (MC, WL, AP ONLY): I-stat hCG, quantitative: 32.4 m[IU]/mL — ABNORMAL HIGH (ref <5)

## 2021-11-07 LAB — LIPASE, BLOOD: Lipase: 25 U/L (ref 11–51)

## 2021-11-07 MED ORDER — ONDANSETRON 4 MG PO TBDP
4.0000 mg | ORAL_TABLET | Freq: Once | ORAL | Status: AC | PRN
  Administered 2021-11-07: 4 mg via ORAL
  Filled 2021-11-07: qty 1

## 2021-11-07 MED ORDER — LIDOCAINE VISCOUS HCL 2 % MT SOLN
15.0000 mL | Freq: Once | OROMUCOSAL | Status: AC
  Administered 2021-11-07: 15 mL via ORAL
  Filled 2021-11-07: qty 15

## 2021-11-07 MED ORDER — ALUM & MAG HYDROXIDE-SIMETH 200-200-20 MG/5ML PO SUSP
30.0000 mL | Freq: Once | ORAL | Status: AC
  Administered 2021-11-07: 30 mL via ORAL
  Filled 2021-11-07: qty 30

## 2021-11-07 NOTE — MAU Provider Note (Addendum)
Chief Complaint: Abdominal Pain ? ? Event Date/Time  ? First Provider Initiated Contact with Patient 11/07/21 [phone]   ?  ?   ?SUBJECTIVE ?HPI: Valerie Erickson is a 21 y.o. G2P0010 at [redacted]w[redacted]d by LMP who presents to maternity admissions reporting epigastric sharp pain since 5pm.  No pain in lower abdomen. ED RN recorded that -pain was all over left abdomen but that is not what patient tells me.  Marland Kitchen ?She denies vaginal bleeding, vaginal itching/burning, urinary symptoms, h/a, dizziness, n/v, or fever/chills.   ? ?Abdominal Pain ?This is a new problem. The current episode started yesterday. The problem occurs constantly. The pain is located in the epigastric region. The quality of the pain is described as burning and sharp. The pain does not radiate. Pertinent negatives include no anorexia, constipation, diarrhea, dysuria, fever, frequency or nausea. Nothing relieves the symptoms. Past treatments include nothing.  ? ?RN Note: ?Pt presents for abd pain since yesterday. Sharp in nature, affecting entire L abd.  ?Endorses nausea without emesis. No aggravating or improving factors. Has tried advil.  ?Denies diarrhea, blood in stool ?Last BM yesterday.  ?Denies abd surgical hx.  ?Last PO intake at 4p.  ? No previous episodes.  ? ?Past Medical History:  ?Diagnosis Date  ? Medical history non-contributory   ? ?Past Surgical History:  ?Procedure Laterality Date  ? KNEE SURGERY    ? ?Social History  ? ?Socioeconomic History  ? Marital status: Single  ?  Spouse name: Not on file  ? Number of children: Not on file  ? Years of education: Not on file  ? Highest education level: Not on file  ?Occupational History  ? Not on file  ?Tobacco Use  ? Smoking status: Never  ? Smokeless tobacco: Not on file  ?Substance and Sexual Activity  ? Alcohol use: Never  ? Drug use: Never  ? Sexual activity: Yes  ?Other Topics Concern  ? Not on file  ?Social History Narrative  ? Not on file  ? ?Social Determinants of Health  ? ?Financial  Resource Strain: Not on file  ?Food Insecurity: Not on file  ?Transportation Needs: Not on file  ?Physical Activity: Not on file  ?Stress: Not on file  ?Social Connections: Not on file  ?Intimate Partner Violence: Not on file  ? ?No current facility-administered medications on file prior to encounter.  ? ?No current outpatient medications on file prior to encounter.  ? ?No Known Allergies ? ?I have reviewed patient's Past Medical Hx, Surgical Hx, Family Hx, Social Hx, medications and allergies.  ? ?ROS:  ?Review of Systems  ?Constitutional:  Negative for fever.  ?Gastrointestinal:  Positive for abdominal pain. Negative for anorexia, constipation, diarrhea and nausea.  ?Genitourinary:  Negative for dysuria and frequency.  ?Review of Systems  ?Other systems negative ? ? ?Physical Exam  ?Physical Exam ?Patient Vitals for the past 24 hrs: ? BP Temp Temp src Pulse Resp SpO2 Height Weight  ?11/07/21 0641 112/61 98.1 ?F (36.7 ?C) Oral 73 20 -- 5\' 6"  (1.676 m) 81.6 kg  ?11/07/21 0328 -- -- -- -- -- -- -- 77.1 kg  ?11/07/21 0322 116/72 97.8 ?F (36.6 ?C) Oral 87 18 97 % -- --  ? ?Constitutional: Well-developed, well-nourished female in no acute distress.  ?Cardiovascular: normal rate ?Respiratory: normal effort ?GI: Abd soft, non-tender. Pos BS x 4 ?MS: Extremities nontender, no edema, normal ROM ?Neurologic: Alert and oriented x 4.  ?GU: Neg CVAT. ? ?PELVIC EXAM: deferred in lieu of  pelvic US ? ?LAB RESULTS ?Results for orders placed or performed during the hospital encounter of 11/07/21 (from the past 24 hour(s))  ?Urinalysis, Routine w reflex microscopic Urine, Clean Catch     Status: Abnormal  ? Collection Time: 11/07/21  3:30 AM  ?Result Value Ref Range  ? Color, Urine YELLOW YELLOW  ? APPearance CLEAR CLEAR  ? Specific Gravity, Urine 1.016 1.005 - 1.030  ? pH 7.0 5.0 - 8.0  ? Glucose, UA NEGATIVE NEGATIVE mg/dL  ? Hgb urine dipstick NEGATIVE NEGATIVE  ? Bilirubin Urine NEGATIVE NEGATIVE  ? Ketones, ur NEGATIVE NEGATIVE  mg/dL  ? Protein, ur NEGATIVE NEGATIVE mg/dL  ? Nitrite NEGATIVE NEGATIVE  ? Leukocytes,Ua MODERATE (A) NEGATIVE  ? RBC / HPF 0-5 0 - 5 RBC/hpf  ? WBC, UA 0-5 0 - 5 WBC/hpf  ? Bacteria, UA NONE SEEN NONE SEEN  ? Squamous Epithelial / LPF 0-5 0 - 5  ? Mucus PRESENT   ?Lipase, blood     Status: None  ? Collection Time: 11/07/21  3:36 AM  ?Result Value Ref Range  ? Lipase 25 11 - 51 U/L  ?Comprehensive metabolic panel     Status: Abnormal  ? Collection Time: 11/07/21  3:36 AM  ?Result Value Ref Range  ? Sodium 135 135 - 145 mmol/L  ? Potassium 3.8 3.5 - 5.1 mmol/L  ? Chloride 110 98 - 111 mmol/L  ? CO2 20 (L) 22 - 32 mmol/L  ? Glucose, Bld 104 (H) 70 - 99 mg/dL  ? BUN 5 (L) 6 - 20 mg/dL  ? Creatinine, Ser 0.53 0.44 - 1.00 mg/dL  ? Calcium 8.6 (L) 8.9 - 10.3 mg/dL  ? Total Protein 6.8 6.5 - 8.1 g/dL  ? Albumin 3.8 3.5 - 5.0 g/dL  ? AST 23 15 - 41 U/L  ? ALT 28 0 - 44 U/L  ? Alkaline Phosphatase 52 38 - 126 U/L  ? Total Bilirubin 0.6 0.3 - 1.2 mg/dL  ? GFR, Estimated >60 >60 mL/min  ? Anion gap 5 5 - 15  ?CBC     Status: Abnormal  ? Collection Time: 11/07/21  3:36 AM  ?Result Value Ref Range  ? WBC 6.1 4.0 - 10.5 K/uL  ? RBC 4.58 3.87 - 5.11 MIL/uL  ? Hemoglobin 14.9 12.0 - 15.0 g/dL  ? HCT 41.2 36.0 - 46.0 %  ? MCV 90.0 80.0 - 100.0 fL  ? MCH 32.5 26.0 - 34.0 pg  ? MCHC 36.2 (H) 30.0 - 36.0 g/dL  ? RDW 12.4 11.5 - 15.5 %  ? Platelets 220 150 - 400 K/uL  ? nRBC 0.0 0.0 - 0.2 %  ?I-Stat beta hCG blood, ED     Status: Abnormal  ? Collection Time: 11/07/21  3:45 AM  ?Result Value Ref Range  ? I-stat hCG, quantitative 32.4 (H) <5 mIU/mL  ? Comment 3          ?Wet prep, genital     Status: Abnormal  ? Collection Time: 11/07/21  8:09 AM  ? Specimen: Vaginal  ?Result Value Ref Range  ? Yeast Wet Prep HPF POC NONE SEEN NONE SEEN  ? Trich, Wet Prep NONE SEEN NONE SEEN  ? Clue Cells Wet Prep HPF POC PRESENT (A) NONE SEEN  ? WBC, Wet Prep HPF POC >=10 (A) <10  ? Sperm NONE SEEN   ?  ? ?IMAGING ?US OB LESS THAN 14 WEEKS WITH OB  TRANSVAGINAL ? ?Result Date: 11/07/2021 ?CLINICAL DATA:  Pelvic  pain pregnant EXAM: OBSTETRIC <14 WK ULTRASOUND TECHNIQUE: Transabdominal ultrasound was performed for evaluation of the gestation as well as the maternal uterus and adnexal regions. COMPARISON:  None Available. FINDINGS: Intrauterine gestational sac: None Yolk sac:  Not Visualized. Embryo:  Not Visualized. Cardiac Activity: Not Visualized. Heart Rate: Not visualized. Subchorionic hemorrhage:  None visualized. Maternal uterus/adnexae: Trace free fluid in the low pelvis. Small bilateral ovarian follicles. Left corpus luteum. IMPRESSION: No candidate intrauterine gestation identified. Early pregnancy of unknown location. Recommend ectopic precautions, serial beta HCG, and follow-up ultrasound in 7 to 14 days to assess for continued development and viability. Electronically Signed   By: Jearld LeschAlex D Bibbey M.D.   On: 11/07/2021 08:14  ? ?US ABDOMEN LIMITED RUQ (LIVER/GB) ? ?Result Date: 11/07/2021 ?CLINICAL DATA:  Epigastric pain, leukocytosis EXAM: ULTRASOUND ABDOMEN LIMITED RIGHT UPPER QUADRANT COMPARISON:  None Available. FINDINGS: Gallbladder: No gallstones or wall thickening visualized. No sonographic Murphy sign noted by sonographer. Common bile duct: Diameter: 2 mm Liver: Parenchymal echogenicity is increased likely reflecting moderate fatty infiltration. There are no focal lesions. Portal vein is patent on color Doppler imaging with normal direction of blood flow towards the liver. Other: None. IMPRESSION: Moderate fatty infiltration of the liver.  No acute findings. Electronically Signed   By: Lesia HausenPeter  Noone M.D.   On: 11/07/2021 08:14   ? ?MAU Management/MDM: ?I have reviewed the triage vital signs and the nursing notes. ?  ?Pertinent labs & imaging results that were available during my care of the patient were reviewed by me and considered in my medical decision making (see chart for details).      I have reviewed her medical records including past  results, notes and treatments.  ? ?Ordered usual first trimester r/o ectopic labs.   ?Pelvic cultures done ?Will check baseline Ultrasound to rule out ectopic. ? ?This bleeding/pain can represent a normal pregnancy wi

## 2021-11-07 NOTE — MAU Note (Signed)
Used interpreter - [place] [phone] ?Switched to [phone] ?Pt went to [facility]- was working yesterday at 5 pm-  upper abd pain- took Ibuprofen - relief x2 hrs  ?Then at - took XS Tyl - 1 tab- some relief ?Also feels slight H/A  ?Feels nausea ?

## 2021-11-07 NOTE — ED Triage Notes (Signed)
Pt presents for abd pain since yesterday. Sharp in nature, affecting entire L abd.  ?Endorses nausea without emesis. No aggravating or improving factors. Has tried advil.  ?Denies diarrhea, blood in stool ?Last BM yesterday.  ?Denies abd surgical hx.  ?Last PO intake at 4p.  ? No previous episodes.  ?

## 2021-11-08 ENCOUNTER — Encounter (HOSPITAL_COMMUNITY): Payer: Self-pay | Admitting: Obstetrics and Gynecology

## 2021-11-08 ENCOUNTER — Inpatient Hospital Stay (HOSPITAL_COMMUNITY)
Admission: AD | Admit: 2021-11-08 | Discharge: 2021-11-08 | Disposition: A | Discharge status: Home / Self Care | Payer: Self-pay | Attending: Obstetrics and Gynecology | Admitting: Obstetrics and Gynecology

## 2021-11-08 ENCOUNTER — Other Ambulatory Visit: Payer: Self-pay

## 2021-11-08 DIAGNOSIS — Z3A01 Less than 8 weeks gestation of pregnancy: Secondary | ICD-10-CM | POA: Insufficient documentation

## 2021-11-08 DIAGNOSIS — O26891 Other specified pregnancy related conditions, first trimester: Secondary | ICD-10-CM | POA: Insufficient documentation

## 2021-11-08 DIAGNOSIS — O99891 Other specified diseases and conditions complicating pregnancy: Secondary | ICD-10-CM | POA: Insufficient documentation

## 2021-11-08 DIAGNOSIS — R519 Headache, unspecified: Secondary | ICD-10-CM | POA: Insufficient documentation

## 2021-11-08 MED ORDER — ACETAMINOPHEN 500 MG PO TABS
1000.0000 mg | ORAL_TABLET | Freq: Once | ORAL | Status: AC
  Administered 2021-11-08: 1000 mg via ORAL
  Filled 2021-11-08: qty 2

## 2021-11-08 MED ORDER — METOCLOPRAMIDE HCL 10 MG PO TABS
10.0000 mg | ORAL_TABLET | Freq: Once | ORAL | Status: AC
  Administered 2021-11-08: 10 mg via ORAL
  Filled 2021-11-08: qty 1

## 2021-11-08 MED ORDER — METOCLOPRAMIDE HCL 10 MG PO TABS
10.0000 mg | ORAL_TABLET | Freq: Four times a day (QID) | ORAL | 0 refills | Status: DC
Start: 2021-11-08 — End: 2022-01-24

## 2021-11-08 NOTE — MAU Note (Addendum)
.  Valerie Erickson is a 21 y.o. at [redacted]w[redacted]d here in MAU reporting: when she left here yesterday she started to have a headache and reports that her head is very "hot". Feels a "little ball" on the posterior left side of her head that is tender. She also states from yesterday to today she started breaking out on her face. The only medications she has taken since yesterday was pepcid and zantac.  ? ?Pain score: 8 ?Vitals:  ? 11/08/21 1752  ?BP: 108/70  ?Pulse: 100  ?Resp: 18  ?Temp: 98.8 ?F (37.1 ?C)  ?SpO2: 98%  ?   ? ? ?

## 2021-11-08 NOTE — MAU Provider Note (Signed)
?History  ?  ? ?CSN: [id number] ? ?Arrival date and time: 11/08/21 1746 ? ? Event Date/Time  ? First Provider Initiated Contact with Patient 11/08/21 1819   ?  ? ?Chief Complaint  ?Patient presents with  ? Headache  ? Back Pain  ? ?HPI ?Valerie Erickson is a 21 y.o. G2P0010 at [redacted]w[redacted]d who presents with a headache. She reports when she got home yesterday, she started having a headache. She reports it is all over her head and she rates it a 10/10. She has not taken any medication for the pain. She also reports a lump on the back of her head that is tender. She reports she noticed it when she was rubbing her head due to the headache. She denies any trauma or injury to her head. She also reports breaking out in acne all over her face since her discharge yesterday.  ? ?OB History   ? ? Gravida  ?2  ? Para  ?   ? Term  ?   ? Preterm  ?   ? AB  ?1  ? Living  ?   ?  ? ? SAB  ?1  ? IAB  ?   ? Ectopic  ?   ? Multiple  ?   ? Live Births  ?   ?   ?  ?  ? ? ?Past Medical History:  ?Diagnosis Date  ? Medical history non-contributory   ? ? ?Past Surgical History:  ?Procedure Laterality Date  ? KNEE SURGERY    ? ? ?History reviewed. No pertinent family history. ? ?Social History  ? ?Tobacco Use  ? Smoking status: Never  ? Smokeless tobacco: Never  ?Substance Use Topics  ? Alcohol use: Never  ? Drug use: Never  ? ? ?Allergies: No Known Allergies ? ?Medications Prior to Admission  ?Medication Sig Dispense Refill Last Dose  ? famotidine (PEPCID) 20 MG tablet Take 20 mg by mouth 2 (two) times daily.     ? ranitidine (ZANTAC) 150 MG capsule Take 150 mg by mouth 2 (two) times daily.     ? ? ?Review of Systems  ?Constitutional: Negative.  Negative for fatigue and fever.  ?HENT: Negative.    ?Respiratory: Negative.  Negative for shortness of breath.   ?Cardiovascular: Negative.  Negative for chest pain.  ?Gastrointestinal: Negative.  Negative for abdominal pain, constipation, diarrhea, nausea and vomiting.  ?Genitourinary: Negative.   Negative for dysuria, vaginal bleeding and vaginal discharge.  ?Neurological:  Positive for headaches. Negative for dizziness.  ?Physical Exam  ? ?Blood pressure 108/70, pulse 100, temperature 98.8 ?F (37.1 ?C), temperature source Oral, resp. rate 18, weight 82 kg, last menstrual period 10/03/2021, SpO2 98 %. ? ?Physical Exam ?Vitals and nursing note reviewed.  ?Constitutional:   ?   General: She is not in acute distress. ?   Appearance: She is well-developed.  ?HENT:  ?   Head: Normocephalic and atraumatic. No contusion or laceration. Hair is normal.  ?Eyes:  ?   Pupils: Pupils are equal, round, and reactive to light.  ?Cardiovascular:  ?   Rate and Rhythm: Normal rate and regular rhythm.  ?   Heart sounds: Normal heart sounds.  ?Pulmonary:  ?   Effort: Pulmonary effort is normal. No respiratory distress.  ?   Breath sounds: Normal breath sounds.  ?Abdominal:  ?   General: Bowel sounds are normal. There is no distension.  ?   Palpations: Abdomen is soft.  ?   Tenderness:  There is no abdominal tenderness.  ?Skin: ?   General: Skin is warm and dry.  ?Neurological:  ?   Mental Status: She is alert and oriented to person, place, and time.  ?Psychiatric:     ?   Mood and Affect: Mood normal.     ?   Behavior: Behavior normal.     ?   Thought Content: Thought content normal.     ?   Judgment: Judgment normal.  ? ? ?MAU Course  ?Procedures ? ?MDM ?Tylenol and reglan PO for headache ?No palpable changes to scalp or skull in area of pain ? ?Assessment and Plan  ? ?1. Pregnancy headache in first trimester   ?2. [redacted] weeks gestation of pregnancy   ? ?-Discharge home in stable condition ?-Rx for reglan sent to patient's pharmacy ?-First trimester precautions discussed ?-Patient advised to follow-up with OB as scheduled for prenatal care ?-Patient may return to MAU as needed or if her condition were to change or worsen ? ? ?Rolm Bookbinder CNM ?11/08/2021, 6:20 PM  ?

## 2021-11-08 NOTE — Discharge Instructions (Signed)

## 2021-11-09 ENCOUNTER — Inpatient Hospital Stay (HOSPITAL_COMMUNITY)
Admit: 2021-11-09 | Discharge: 2021-11-09 | Disposition: A | Discharge status: Home / Self Care | Payer: Self-pay | Attending: Obstetrics & Gynecology | Admitting: Obstetrics & Gynecology

## 2021-11-09 ENCOUNTER — Inpatient Hospital Stay (HOSPITAL_COMMUNITY)
Admission: AD | Admit: 2021-11-09 | Discharge: 2021-11-09 | Disposition: A | Discharge status: Home / Self Care | Payer: Self-pay | Attending: Obstetrics & Gynecology | Admitting: Obstetrics & Gynecology

## 2021-11-09 DIAGNOSIS — L989 Disorder of the skin and subcutaneous tissue, unspecified: Secondary | ICD-10-CM | POA: Insufficient documentation

## 2021-11-09 DIAGNOSIS — O3680X0 Pregnancy with inconclusive fetal viability, not applicable or unspecified: Secondary | ICD-10-CM | POA: Insufficient documentation

## 2021-11-09 DIAGNOSIS — Z3A00 Weeks of gestation of pregnancy not specified: Secondary | ICD-10-CM | POA: Insufficient documentation

## 2021-11-09 DIAGNOSIS — O99711 Diseases of the skin and subcutaneous tissue complicating pregnancy, first trimester: Secondary | ICD-10-CM | POA: Insufficient documentation

## 2021-11-09 LAB — HCG, QUANTITATIVE, PREGNANCY: hCG, Beta Chain, Quant, S: 66 m[IU]/mL — ABNORMAL HIGH (ref <5)

## 2021-11-09 MED ORDER — DIPHENHYDRAMINE HCL 25 MG PO CAPS
50.0000 mg | ORAL_CAPSULE | Freq: Once | ORAL | Status: AC
  Administered 2021-11-09: 50 mg via ORAL
  Filled 2021-11-09: qty 2

## 2021-11-09 MED ORDER — DIPHENHYDRAMINE-ZINC ACETATE 2-0.1 % EX CREA: 1.0000 "application " | TOPICAL_CREAM | Freq: Three times a day (TID) | CUTANEOUS | 0 refills | Status: DC | PRN

## 2021-11-09 NOTE — MAU Note (Signed)
Pt reports to mau for follow up hcg.  Denies pain or bleeding today.   ?

## 2021-11-09 NOTE — MAU Provider Note (Signed)
?History  ?  ? ?CSN: [id number] ? ?Arrival date and time: 11/09/21 0850 ? ? Event Date/Time  ? First Provider Initiated Contact with Patient 11/09/21 1005   ?  ? ?Chief Complaint  ?Patient presents with  ? Follow-up  ? ?HPI ?Valerie Erickson is a 21 y.o. G2P0010 in early pregnancy who presents to MAU for repeat stat Quant hCG. She denies abdominal pain or vaginal bleeding. Patient states she has never had those symptoms.  ? ?Patient also expresses ongoing concern for lesions on her face, chest and back which erupted the morning after she initiated Zantac and Pepcid. Lesions are red, disseminated, itchy. She has not noticed drainage. She has not taken medication for this complaint. She denies SOB, hoarse voice, cough, weakness, syncope. She has not initiated use of any new detergents or soaps.  ? ?OB History   ? ? Gravida  ?2  ? Para  ?   ? Term  ?   ? Preterm  ?   ? AB  ?1  ? Living  ?   ?  ? ? SAB  ?1  ? IAB  ?   ? Ectopic  ?   ? Multiple  ?   ? Live Births  ?   ?   ?  ?  ? ? ?Past Medical History:  ?Diagnosis Date  ? Medical history non-contributory   ? ? ?Past Surgical History:  ?Procedure Laterality Date  ? KNEE SURGERY    ? ? ?No family history on file. ? ?Social History  ? ?Tobacco Use  ? Smoking status: Never  ? Smokeless tobacco: Never  ?Substance Use Topics  ? Alcohol use: Never  ? Drug use: Never  ? ? ?Allergies: No Known Allergies ? ?Medications Prior to Admission  ?Medication Sig Dispense Refill Last Dose  ? famotidine (PEPCID) 20 MG tablet Take 20 mg by mouth 2 (two) times daily.     ? metoCLOPramide (REGLAN) 10 MG tablet Take 1 tablet (10 mg total) by mouth every 6 (six) hours. 30 tablet 0   ? ranitidine (ZANTAC) 150 MG capsule Take 150 mg by mouth 2 (two) times daily.     ? ? ?Review of Systems  ?Skin:  Positive for rash.  ?All other systems reviewed and are negative. ?Physical Exam  ? ?Blood pressure 110/63, pulse 98, temperature 98.7 ?F (37.1 ?C), temperature source Oral, resp. rate 16,  last menstrual period 10/03/2021, SpO2 (!) 2 %. ? ?Physical Exam ?Vitals and nursing note reviewed. Exam conducted with a chaperone present.  ?Constitutional:   ?   Appearance: Normal appearance. She is not ill-appearing.  ?Cardiovascular:  ?   Rate and Rhythm: Normal rate and regular rhythm.  ?   Pulses: Normal pulses.  ?   Heart sounds: Normal heart sounds.  ?Pulmonary:  ?   Effort: Pulmonary effort is normal.  ?   Breath sounds: Normal breath sounds.  ?Abdominal:  ?   General: Abdomen is flat.  ?   Tenderness: There is no abdominal tenderness.  ?Skin: ?   Capillary Refill: Capillary refill takes less than 2 seconds.  ?   Comments: Small and medium closed lesions disseminated across face, along hairline at occiput, along spine and into area of upper buttocks. Some resemble pustules, others resemble comedones. No drainage noted. Rare excoriation marks noted  ?Neurological:  ?   Mental Status: She is alert and oriented to person, place, and time.  ?Psychiatric:     ?   Mood  and Affect: Mood normal.     ?   Behavior: Behavior normal.     ?   Thought Content: Thought content normal.     ?   Judgment: Judgment normal.  ? ? ?MAU Course  ?Procedures ? ?MDM ? ?--EMR reviewed. Quant hCG was evaluated during previous MAU encounter for RUQ pain. As patient attests she has never reported concerning pregnancy-related symptoms (e.g. vaginal bleeding, lower abdominal pain). Appropriate rise in quant hCG. Quant now 66, 32.4 48 hours ago. Advised repeat ultrasound in 14-16 days rather than 10-14 days given very early pregnancy ? ?--Lesions as possible side effect after new medications initiated. Advised patient to discontinue use of those medications immediately. Can alternate PO Benadryl with topical Benadryl PRN.  ? ?Orders Placed This Encounter  ?Procedures  ? US OB Transvaginal  ? hCG, quantitative, pregnancy  ? Discharge patient  ? ?Patient Vitals for the past 24 hrs: ? BP Temp Temp src Pulse Resp SpO2  ?11/09/21 0906 110/63  98.7 ?F (37.1 ?C) Oral 98 16 (!) 2 %  ? ? ?Meds ordered this encounter  ?Medications  ? diphenhydrAMINE (BENADRYL) capsule 50 mg  ? diphenhydrAMINE-zinc acetate (BENADRYL EXTRA STRENGTH) cream  ?  Sig: Apply 1 application. topically 3 (three) times daily as needed for itching.  ?  Dispense:  28.4 g  ?  Refill:  0  ?  Order Specific Question:   Supervising Provider  ?  AnswerMilas Hock [[id number]]  ? ? ?Assessment and Plan  ?--21 y.o. G2P0010 PUL ?--Appropriate change in Quant hcg ?--Skin lesions, topical Benadryl cream PRN ?--Language barrier: spanish language interpreter present for all patient interaction ?--Discharge home in stable condition with ectopic precautions ? ?F/U ?Repeat US at [facility] in 14-16 days ? ?Calvert Cantor, MSA, MSN, CNM ?11/09/2021, 1:52 PM  ?

## 2021-11-10 ENCOUNTER — Encounter: Payer: Self-pay | Admitting: Orthopedic Surgery

## 2021-11-10 LAB — GC/CHLAMYDIA PROBE AMP (~~LOC~~) NOT AT ARMC
Chlamydia: NEGATIVE
Comment: NEGATIVE
Comment: NORMAL
Neisseria Gonorrhea: NEGATIVE

## 2021-11-27 ENCOUNTER — Other Ambulatory Visit: Payer: Self-pay

## 2021-11-27 ENCOUNTER — Inpatient Hospital Stay (HOSPITAL_COMMUNITY)
Admission: AD | Admit: 2021-11-27 | Discharge: 2021-11-28 | Disposition: A | Discharge status: Home / Self Care | Payer: Self-pay | Attending: Obstetrics and Gynecology | Admitting: Obstetrics and Gynecology

## 2021-11-27 DIAGNOSIS — M545 Low back pain, unspecified: Secondary | ICD-10-CM | POA: Insufficient documentation

## 2021-11-27 DIAGNOSIS — Z3A01 Less than 8 weeks gestation of pregnancy: Secondary | ICD-10-CM | POA: Insufficient documentation

## 2021-11-27 DIAGNOSIS — O26891 Other specified pregnancy related conditions, first trimester: Secondary | ICD-10-CM | POA: Insufficient documentation

## 2021-11-27 DIAGNOSIS — R109 Unspecified abdominal pain: Secondary | ICD-10-CM | POA: Insufficient documentation

## 2021-11-27 DIAGNOSIS — Z3491 Encounter for supervision of normal pregnancy, unspecified, first trimester: Secondary | ICD-10-CM

## 2021-11-27 DIAGNOSIS — O209 Hemorrhage in early pregnancy, unspecified: Secondary | ICD-10-CM | POA: Insufficient documentation

## 2021-11-27 LAB — URINALYSIS, ROUTINE W REFLEX MICROSCOPIC
Bilirubin Urine: NEGATIVE
Glucose, UA: NEGATIVE mg/dL
Ketones, ur: NEGATIVE mg/dL
Leukocytes,Ua: NEGATIVE
Nitrite: NEGATIVE
Protein, ur: NEGATIVE mg/dL
Specific Gravity, Urine: 1.01 (ref 1.005–1.030)
pH: 6 (ref 5.0–8.0)
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
## 2021-11-27 NOTE — MAU Note (Addendum)
Pt says with interpreter- has VB Started on Monday - when she wipes  Cramping started Monday  Had sex on Tuesday

## 2021-11-28 ENCOUNTER — Inpatient Hospital Stay (HOSPITAL_COMMUNITY): Payer: Self-pay

## 2021-11-28 DIAGNOSIS — O209 Hemorrhage in early pregnancy, unspecified: Secondary | ICD-10-CM

## 2021-11-28 DIAGNOSIS — Z3A01 Less than 8 weeks gestation of pregnancy: Secondary | ICD-10-CM

## 2021-12-08 ENCOUNTER — Inpatient Hospital Stay: Admission: RE | Admit: 2021-12-08 | Payer: Self-pay | Source: Ambulatory Visit

## 2022-01-24 ENCOUNTER — Encounter (HOSPITAL_COMMUNITY): Payer: Self-pay | Admitting: Obstetrics and Gynecology

## 2022-01-24 ENCOUNTER — Inpatient Hospital Stay (HOSPITAL_COMMUNITY): Payer: Self-pay

## 2022-01-24 ENCOUNTER — Inpatient Hospital Stay (HOSPITAL_COMMUNITY)
Admission: AD | Admit: 2022-01-24 | Discharge: 2022-01-24 | Disposition: A | Discharge status: Home / Self Care | Payer: Self-pay | Attending: Obstetrics and Gynecology | Admitting: Obstetrics and Gynecology

## 2022-01-24 DIAGNOSIS — R197 Diarrhea, unspecified: Secondary | ICD-10-CM

## 2022-01-24 DIAGNOSIS — O039 Complete or unspecified spontaneous abortion without complication: Secondary | ICD-10-CM

## 2022-01-24 DIAGNOSIS — R1084 Generalized abdominal pain: Secondary | ICD-10-CM

## 2022-01-24 DIAGNOSIS — Z3202 Encounter for pregnancy test, result negative: Secondary | ICD-10-CM | POA: Insufficient documentation

## 2022-01-24 HISTORY — DX: Depression, unspecified: F32.A

## 2022-01-24 HISTORY — DX: Cardiac murmur, unspecified: R01.1

## 2022-01-24 HISTORY — DX: Unspecified ovarian cyst, unspecified side: N83.209

## 2022-01-24 HISTORY — DX: Headache, unspecified: R51.9

## 2022-01-24 LAB — COMPREHENSIVE METABOLIC PANEL
ALT: 43 U/L (ref 0–44)
AST: 24 U/L (ref 15–41)
Albumin: 3.7 g/dL (ref 3.5–5.0)
Alkaline Phosphatase: 61 U/L (ref 38–126)
Anion gap: 5 (ref 5–15)
BUN: 12 mg/dL (ref 6–20)
CO2: 22 mmol/L (ref 22–32)
Calcium: 8.6 mg/dL — ABNORMAL LOW (ref 8.9–10.3)
Chloride: 108 mmol/L (ref 98–111)
Creatinine, Ser: 0.61 mg/dL (ref 0.44–1.00)
GFR, Estimated: >60 mL/min (ref >60)
Glucose, Bld: 98 mg/dL (ref 70–99)
Potassium: 3.7 mmol/L (ref 3.5–5.1)
Sodium: 135 mmol/L (ref 135–145)
Total Bilirubin: 0.7 mg/dL (ref 0.3–1.2)
Total Protein: 6.3 g/dL — ABNORMAL LOW (ref 6.5–8.1)

## 2022-01-24 LAB — WET PREP, GENITAL
Sperm: NONE SEEN
Trich, Wet Prep: NONE SEEN
WBC, Wet Prep HPF POC: <10 (ref <10)
Yeast Wet Prep HPF POC: NONE SEEN

## 2022-01-24 LAB — CBC WITH DIFFERENTIAL/PLATELET
Abs Immature Granulocytes: 0.08 10*3/uL — ABNORMAL HIGH (ref 0.00–0.07)
Basophils Absolute: 0 10*3/uL (ref 0.0–0.1)
Basophils Relative: 0 %
Eosinophils Absolute: 0.2 10*3/uL (ref 0.0–0.5)
Eosinophils Relative: 2 %
HCT: 38.9 % (ref 36.0–46.0)
Hemoglobin: 13.9 g/dL (ref 12.0–15.0)
Immature Granulocytes: 1 %
Lymphocytes Relative: 25 %
Lymphs Abs: 2.8 10*3/uL (ref 0.7–4.0)
MCH: 31.8 pg (ref 26.0–34.0)
MCHC: 35.7 g/dL (ref 30.0–36.0)
MCV: 89 fL (ref 80.0–100.0)
Monocytes Absolute: 0.8 10*3/uL (ref 0.1–1.0)
Monocytes Relative: 7 %
Neutro Abs: 7.1 10*3/uL (ref 1.7–7.7)
Neutrophils Relative %: 65 %
Platelets: 248 10*3/uL (ref 150–400)
RBC: 4.37 MIL/uL (ref 3.87–5.11)
RDW: 12.5 % (ref 11.5–15.5)
WBC: 11 10*3/uL — ABNORMAL HIGH (ref 4.0–10.5)
nRBC: 0 % (ref 0.0–0.2)

## 2022-01-24 LAB — URINALYSIS, ROUTINE W REFLEX MICROSCOPIC
Bilirubin Urine: NEGATIVE
Glucose, UA: NEGATIVE mg/dL
Hgb urine dipstick: NEGATIVE
Ketones, ur: NEGATIVE mg/dL
Leukocytes,Ua: NEGATIVE
Nitrite: NEGATIVE
Protein, ur: NEGATIVE mg/dL
Specific Gravity, Urine: 1.017 (ref 1.005–1.030)
pH: 7 (ref 5.0–8.0)

## 2022-01-24 LAB — HCG, QUANTITATIVE, PREGNANCY: hCG, Beta Chain, Quant, S: 1 m[IU]/mL (ref <5)

## 2022-01-24 LAB — PREGNANCY, URINE: Preg Test, Ur: NEGATIVE

## 2022-01-24 LAB — ABO/RH: ABO/RH(D): O POS

## 2022-01-24 NOTE — MAU Note (Signed)
Valerie Erickson is a 21 y.o. at [redacted]w[redacted]d here in MAU reporting: was at work this morning, has some pain in lower abd. Now it is getting worse. Cramping. No bleeding or d/c.  Having pain with urination (that also started this morning), going more ofter, goes normal amt.  Had diarrhea 3 or 4 times yesterday, as soon as she ate, she would go.  Twice today. Has been watery.  No vomiting.  Has not been around anyone else with it.   Onset of complaint: this morning Pain score: 5 Vitals:   01/24/22 1804  BP: 103/61  Pulse: 75  Resp: 17  Temp: 98 F (36.7 C)  SpO2: 99%     XNT:ZGYFVC to hear with doppler Lab orders placed from triage:  urine

## 2022-01-24 NOTE — MAU Provider Note (Signed)
None     S [name] is a 21 y.o. G2P0010 patient who presents to MAU today with complaint of abdominal pain and diarrhea. Patient reports her symptoms started this morning and have been intermittent.  She describes the pain as cramping.  She reports that she is [redacted] weeks pregnant.  However dopplers not heard and no IUP on [place].  UPT is negative.   O BP 103/61 (BP Location: Right Arm)   Pulse 75   Temp 98 F (36.7 C) (Oral)   Resp 17   Ht 5\' 6"  (1.676 m)   Wt 88.2 kg   LMP 10/03/2021 (Approximate)   SpO2 99%   BMI 31.39 kg/m  Physical Exam Vitals reviewed. Exam conducted with a chaperone present.  Constitutional:      General: She is not in acute distress.    Appearance: She is well-developed.  HENT:     Head: Normocephalic and atraumatic.  Eyes:     Conjunctiva/sclera: Conjunctivae normal.  Cardiovascular:     Rate and Rhythm: Normal rate.  Pulmonary:     Effort: Pulmonary effort is normal. No respiratory distress.  Musculoskeletal:     Cervical back: Normal range of motion.  Neurological:     Mental Status: She is alert and oriented to person, place, and time.  Psychiatric:        Mood and Affect: Mood normal.        Behavior: Behavior normal.     A Medical screening exam complete Negative UPT SAB  P -Labs and 12/03/2021 ordered by other provider. -Results reviewed and concern for SAB. -UPT performed and negative. -Provider to bedside to discuss findings with patient. -Patient informs provider that she had bleeding for one week starting June 17th that was like a period. -Informed that this was a miscarriage. -Condolences given. -Patient without questions. -Discharge from MAU in stable condition -Patient given the option of transfer to [facility] for further evaluation or seek care in outpatient facility of choice. Declines -Interpretations completed with in-person assistance by Raquel.   [facility], CNM 01/24/2022 9:10 PM

## 2022-01-26 LAB — GC/CHLAMYDIA PROBE AMP (~~LOC~~) NOT AT ARMC
Chlamydia: NEGATIVE
Comment: NEGATIVE
Comment: NORMAL
Neisseria Gonorrhea: NEGATIVE

## 2022-01-28 ENCOUNTER — Inpatient Hospital Stay (HOSPITAL_COMMUNITY)
Admission: AD | Admit: 2022-01-28 | Discharge: 2022-01-28 | Disposition: A | Discharge status: Home / Self Care | Payer: Self-pay | Attending: Obstetrics and Gynecology | Admitting: Obstetrics and Gynecology

## 2022-01-28 DIAGNOSIS — Z3A14 14 weeks gestation of pregnancy: Secondary | ICD-10-CM

## 2022-01-28 DIAGNOSIS — Z8759 Personal history of other complications of pregnancy, childbirth and the puerperium: Secondary | ICD-10-CM | POA: Insufficient documentation

## 2022-01-28 DIAGNOSIS — R109 Unspecified abdominal pain: Secondary | ICD-10-CM | POA: Insufficient documentation

## 2022-01-28 DIAGNOSIS — Z789 Other specified health status: Secondary | ICD-10-CM

## 2022-01-28 NOTE — MAU Provider Note (Signed)
Event Date/Time   First Provider Initiated Contact with Patient 01/28/22 2246      S [name] is a 21 y.o. G2P0010 patient who presents to MAU with questions surrounding her previous ultrasound and lab work from her last MAU visit. She has concerns for retained products, in presence of fever and intermittent abdominal pain. Patient states that she "felt her face and felt warm."  She denies urinary and vaginal symptoms. She denies vaginal bleeding. She also denies cold and flu like symptoms and has no other complaints or concerns.    O BP 110/65 (BP Location: Right Arm)   Pulse 100   Temp 98 F (36.7 C) (Oral)   Resp 18   Ht 5\' 6"  (1.676 m)   Wt 88 kg   LMP 10/03/2021 (Approximate)   SpO2 99%   BMI 31.31 kg/m  Physical Exam Vitals and nursing note reviewed.  Constitutional:      General: She is not in acute distress.    Appearance: Normal appearance.  HENT:     Head: Normocephalic.  Pulmonary:     Effort: Pulmonary effort is normal.  Musculoskeletal:     Cervical back: Normal range of motion.  Skin:    General: Skin is warm and dry.  Neurological:     Mental Status: She is alert and oriented to person, place, and time.  Psychiatric:        Mood and Affect: Mood normal.     A Medical screening exam complete  Vital signs normal upon arrival to MAU. -Both ultrasound and lab results reviewed with the patient.   P Discharge from MAU in stable condition Patient given the option of transfer to [facility] for further evaluation or seek care in outpatient facility of choice *** List of options for follow-up given *** Warning signs for worsening condition that would warrant emergency follow-up discussed Patient may return to MAU as needed   [facility], Carlynn Herald 01/28/2022 10:46 PM

## 2022-01-28 NOTE — MAU Note (Signed)
..  Valerie Erickson is a 21 y.o. at unknown here in MAU reporting: she is having lower abdominal cramping and a fever.  Reports that she is confused from her last MAU visit. Where she was told she had a miscarriage but was unsure if she needed other tests done to make sure she was okay.   Pain score: 5/10 Vitals:   01/28/22 2221  BP: 110/65  Pulse: 100  Resp: 18  Temp: 98 F (36.7 C)  SpO2: 99%      Lab orders placed from triage:  none

## 2022-02-18 ENCOUNTER — Encounter: Payer: Self-pay | Admitting: Family Medicine

## 2022-06-29 NOTE — L&D Delivery Note (Signed)
OB/GYN Faculty Practice Delivery Note  Valerie Erickson [name] is a 22 y.o. [id number] s/p SVD at [redacted]w[redacted]d. She was admitted for SOL /IOL dor PD .   ROM: 3h 2m with clear fluid GBS Status:  Negative/-- (05/22 1140) Maximum Maternal Temperature:  Temp (24hrs), Avg:98.2 F (36.8 C), Min:98 F (36.7 C), Max:98.5 F (36.9 C)    Labor Progress: Patient arrived at 1.5 cm dilation and was induced with AROM, cytotec, pitocin .   Delivery Date/Time: 12/16/2022 at 0908 Delivery: Called to room and patient was complete and pushing. Head delivered in LOA position. No nuchal cord present. Shoulder and body delivered in usual fashion. Infant with spontaneous cry, placed on mother's abdomen, dried and stimulated. Cord clamped x 2 after 1-minute delay, and cut by FOB. Cord blood drawn. Placenta delivered spontaneously with gentle cord traction. Fundus firm with massage and Pitocin. Labia, perineum, vagina, and cervix inspected with 2nd degree perineal tear repaired with 3-0 vicryl suture. .   Placenta:  spontaneous, intact, 3 vessel cord  Complications: EBL approximately 900 ml. Lower uterine segment atony. Patient given methergin  Lacerations: 2nd degree perineal repaired EBL: 908 mL Analgesia: epidural    Infant: APGAR (1 MIN): 9   APGAR (5 MINS): 9   APGAR (10 MINS):    Weight: 3410 g  Derrel Nip, MD  OB Fellow  12/16/2022 10:37 AM

## 2022-06-30 ENCOUNTER — Ambulatory Visit (INDEPENDENT_AMBULATORY_CARE_PROVIDER_SITE_OTHER): Payer: Self-pay | Admitting: Obstetrics

## 2022-06-30 ENCOUNTER — Encounter: Payer: Self-pay | Admitting: Obstetrics

## 2022-06-30 ENCOUNTER — Other Ambulatory Visit (HOSPITAL_COMMUNITY)
Admission: RE | Admit: 2022-06-30 | Discharge: 2022-06-30 | Disposition: A | Discharge status: Home / Self Care | Payer: Self-pay | Source: Ambulatory Visit | Attending: Obstetrics | Admitting: Obstetrics

## 2022-06-30 VITALS — BP 107/60 | HR 75 | Wt 197.7 lb

## 2022-06-30 DIAGNOSIS — Z3480 Encounter for supervision of other normal pregnancy, unspecified trimester: Secondary | ICD-10-CM | POA: Insufficient documentation

## 2022-06-30 DIAGNOSIS — Z3687 Encounter for antenatal screening for uncertain dates: Secondary | ICD-10-CM

## 2022-06-30 DIAGNOSIS — Z789 Other specified health status: Secondary | ICD-10-CM

## 2022-06-30 DIAGNOSIS — Z3A09 9 weeks gestation of pregnancy: Secondary | ICD-10-CM

## 2022-06-30 DIAGNOSIS — Z3481 Encounter for supervision of other normal pregnancy, first trimester: Secondary | ICD-10-CM

## 2022-06-30 MED ORDER — PNV PRENATAL PLUS MULTIVITAMIN 27-1 MG PO TABS: 1.0000 | ORAL_TABLET | Freq: Every day | ORAL | 4 refills | Status: AC

## 2022-06-30 NOTE — Progress Notes (Signed)
Patient presents for New OB. Patient unsure of dates. Patient has no concern.

## 2022-06-30 NOTE — Progress Notes (Addendum)
Subjective:    Valerie Erickson is being seen today for her first obstetrical visit.  This is not a planned pregnancy. She is at [redacted]w[redacted]d gestation. Her obstetrical history is significant for  none . Relationship with FOB: significant other, not living together. Patient does intend to breast feed. Pregnancy history fully reviewed.  The information documented in the HPI was reviewed and verified.  Menstrual History: OB History     Gravida  3   Para  0   Term  0   Preterm  0   AB  2   Living         SAB  2   IAB  0   Ectopic  0   Multiple      Live Births               Patient's last menstrual period was 04/22/2022.    Past Medical History:  Diagnosis Date   Depression    "has sad days"   H/O one miscarriage    Headache    Heart murmur    "when in [place]"   Ovarian cyst     Past Surgical History:  Procedure Laterality Date   DILATION AND CURETTAGE, DIAGNOSTIC / THERAPEUTIC  11/03/2020   FEMUR FRACTURE SURGERY Right 10/28/2020   KNEE SURGERY Right 10/31/2020   KNEE SURGERY      (Not in a hospital admission)  No Known Allergies  Social History   Tobacco Use   Smoking status: Never   Smokeless tobacco: Never  Substance Use Topics   Alcohol use: Never    Family History  Problem Relation Age of Onset   Hyperlipidemia Mother    Kidney disease Father        unsure of specifics   Diabetes Father    Hypertension Father    Cirrhosis Father      Review of Systems Constitutional: negative for weight loss Gastrointestinal: positive for nausea, vomiting and heartburn Genitourinary:negative for genital lesions and vaginal discharge and dysuria Musculoskeletal:negative for back pain Behavioral/Psych: negative for abusive relationship, depression, illegal drug usage and tobacco use    Objective:    BP 107/60   Pulse 75   Wt 197 lb 11.2 oz (89.7 kg)   LMP 04/22/2022   Breastfeeding Unknown   BMI 31.91 kg/m  General Appearance:    Alert,  cooperative, no distress, appears stated age  Head:    Normocephalic, without obvious abnormality, atraumatic  Eyes:    PERRL, conjunctiva/corneas clear, EOM's intact, fundi    benign, both eyes  Ears:    Normal TM's and external ear canals, both ears  Nose:   Nares normal, septum midline, mucosa normal, no drainage    or sinus tenderness  Throat:   Lips, mucosa, and tongue normal; teeth and gums normal  Neck:   Supple, symmetrical, trachea midline, no adenopathy;    thyroid:  no enlargement/tenderness/nodules; no carotid   bruit or JVD  Back:     Symmetric, no curvature, ROM normal, no CVA tenderness  Lungs:     Clear to auscultation bilaterally, respirations unlabored  Chest Wall:    No tenderness or deformity   Heart:    Regular rate and rhythm, S1 and S2 normal, no murmur, rub   or gallop  Breast Exam:    No tenderness, masses, or nipple abnormality  Abdomen:     Soft, non-tender, bowel sounds active all four quadrants,    no masses, no organomegaly  Genitalia:  Normal female without lesion, discharge or tenderness  Extremities:   Extremities normal, atraumatic, no cyanosis or edema  Pulses:   2+ and symmetric all extremities  Skin:   Skin color, texture, turgor normal, no rashes or lesions  Lymph nodes:   Cervical, supraclavicular, and axillary nodes normal  Neurologic:   CNII-XII intact, normal strength, sensation and reflexes    throughout      Lab Review Urine pregnancy test Labs reviewed yes Radiologic studies reviewed yes  Assessment:    Pregnancy at [redacted]w[redacted]d weeks    Plan:    1. Supervision of other normal pregnancy, antepartum Rx: - CBC/D/Plt+RPR+Rh+ABO+RubIgG... - Culture, OB Urine - Cervicovaginal ancillary only( [place]) - Cytology - PAP( [place]) - Prenatal Vit-Fe Fumarate-FA (PNV PRENATAL PLUS MULTIVITAMIN) 27-1 MG TABS; Take 1 tablet by mouth daily before breakfast.  Dispense: 90 tablet; Refill: 4  2. Unsure of LMP (last menstrual period) as reason  for ultrasound scan  3. Language barrier affecting health care - interpreter present    Prenatal vitamins.  Counseling provided regarding continued use of seat belts, cessation of alcohol consumption, smoking or use of illicit drugs; infection precautions i.e., influenza/TDAP immunizations, toxoplasmosis,CMV, parvovirus, listeria and varicella; workplace safety, exercise during pregnancy; routine dental care, safe medications, sexual activity, hot tubs, saunas, pools, travel, caffeine use, fish and methlymercury, potential toxins, hair treatments, varicose veins Weight gain recommendations per IOM guidelines reviewed: underweight/BMI< 18.5--> gain 28 - 40 lbs; normal weight/BMI 18.5 - 24.9--> gain 25 - 35 lbs; overweight/BMI 25 - 29.9--> gain 15 - 25 lbs; obese/BMI >30->gain  11 - 20 lbs Problem list reviewed and updated. FIRST/CF mutation testing/NIPT/QUAD SCREEN/fragile X/Ashkenazi Jewish population testing/Spinal muscular atrophy discussed: requested. Role of ultrasound in pregnancy discussed; fetal survey: requested. Amniocentesis discussed: not indicated.  Meds ordered this encounter  Medications   Prenatal Vit-Fe Fumarate-FA (PNV PRENATAL PLUS MULTIVITAMIN) 27-1 MG TABS    Sig: Take 1 tablet by mouth daily before breakfast.    Dispense:  90 tablet    Refill:  4   Orders Placed This Encounter  Procedures   Culture, OB Urine   CBC/D/Plt+RPR+Rh+ABO+RubIgG...    Follow up in 4 weeks.  I have spent a total of 20 minutes of face-to-face time, excluding clinical staff time, reviewing notes and preparing to see patient, ordering tests and/or medications, and counseling the patient.   Gleen Ripberger A. Jodi Mourning MD 06/30/2022

## 2022-07-01 LAB — CERVICOVAGINAL ANCILLARY ONLY
Chlamydia: NEGATIVE
Comment: NEGATIVE
Comment: NEGATIVE
Comment: NORMAL
Neisseria Gonorrhea: NEGATIVE
Trichomonas: NEGATIVE

## 2022-07-01 LAB — CBC/D/PLT+RPR+RH+ABO+RUBIGG...
Antibody Screen: NEGATIVE
Basophils Absolute: 0 10*3/uL (ref 0.0–0.2)
Basos: 0 %
EOS (ABSOLUTE): 0.2 10*3/uL (ref 0.0–0.4)
Eos: 2 %
HCV Ab: NONREACTIVE
HIV Screen 4th Generation wRfx: NONREACTIVE
Hematocrit: 42.6 % (ref 34.0–46.6)
Hemoglobin: 14.2 g/dL (ref 11.1–15.9)
Hepatitis B Surface Ag: NEGATIVE
Immature Grans (Abs): 0.2 10*3/uL — ABNORMAL HIGH (ref 0.0–0.1)
Immature Granulocytes: 2 %
Lymphocytes Absolute: 1.8 10*3/uL (ref 0.7–3.1)
Lymphs: 21 %
MCH: 31.6 pg (ref 26.6–33.0)
MCHC: 33.3 g/dL (ref 31.5–35.7)
MCV: 95 fL (ref 79–97)
Monocytes Absolute: 0.5 10*3/uL (ref 0.1–0.9)
Monocytes: 6 %
Neutrophils Absolute: 6.1 10*3/uL (ref 1.4–7.0)
Neutrophils: 69 %
Platelets: 291 10*3/uL (ref 150–450)
RBC: 4.5 x10E6/uL (ref 3.77–5.28)
RDW: 12.5 % (ref 11.7–15.4)
RPR Ser Ql: NONREACTIVE
Rh Factor: POSITIVE
Rubella Antibodies, IGG: 1.73 index (ref >0.99)
WBC: 8.7 10*3/uL (ref 3.4–10.8)

## 2022-07-01 LAB — HCV INTERPRETATION

## 2022-07-02 LAB — URINE CULTURE, OB REFLEX

## 2022-07-02 LAB — CULTURE, OB URINE

## 2022-07-06 LAB — CYTOLOGY - PAP
Comment: NEGATIVE
Diagnosis: UNDETERMINED — AB
High risk HPV: NEGATIVE

## 2022-07-08 NOTE — Progress Notes (Signed)
TC using Administrator, sports. Advised of Pap results and recommendations for repeat Pap in one year. Verbalized understanding. All questions were answered.

## 2022-07-28 ENCOUNTER — Ambulatory Visit (INDEPENDENT_AMBULATORY_CARE_PROVIDER_SITE_OTHER): Payer: Self-pay

## 2022-07-28 VITALS — BP 108/69 | HR 78 | Wt 197.0 lb

## 2022-07-28 DIAGNOSIS — Z3482 Encounter for supervision of other normal pregnancy, second trimester: Secondary | ICD-10-CM

## 2022-07-28 DIAGNOSIS — Z3A20 20 weeks gestation of pregnancy: Secondary | ICD-10-CM

## 2022-07-28 DIAGNOSIS — Z3480 Encounter for supervision of other normal pregnancy, unspecified trimester: Secondary | ICD-10-CM

## 2022-07-28 DIAGNOSIS — Z789 Other specified health status: Secondary | ICD-10-CM

## 2022-07-28 DIAGNOSIS — R87619 Unspecified abnormal cytological findings in specimens from cervix uteri: Secondary | ICD-10-CM | POA: Insufficient documentation

## 2022-07-28 NOTE — Progress Notes (Signed)
   PRENATAL VISIT NOTE  Subjective:  Valerie Erickson is a 22 y.o. G3P0020 at [redacted]w[redacted]d being seen today for ongoing prenatal care.  She is currently monitored for the following issues for this low-risk pregnancy and has Encounter for annual physical exam; History of spontaneous abortion; Anxiety and depression; Patellar sleeve fracture of right knee, open type I or II, with delayed healing, subsequent encounter; Need for follow-up by social worker; Immigrant with language difficulty; Closed fracture of shaft of right femur with routine healing; Shortness of breath; Generalized anxiety disorder; Gastroesophageal reflux disease; Supervision of other normal pregnancy, antepartum; and Abnormal Pap smear of cervix on their problem list.  Patient reports no complaints.  Contractions: Not present. Vag. Bleeding: None.  Movement: Absent (hasn't felt movement yet). Denies leaking of fluid.   The following portions of the patient's history were reviewed and updated as appropriate: allergies, current medications, past family history, past medical history, past social history, past surgical history and problem list.   Objective:   Vitals:   07/28/22 0935  BP: 108/69  Pulse: 78  Weight: 197 lb (89.4 kg)    Fetal Status: Fetal Heart Rate (bpm): 154   Movement: Absent (hasn't felt movement yet)     General:  Alert, oriented and cooperative. Patient is in no acute distress.  Skin: Skin is warm and dry. No rash noted.   Cardiovascular: Normal heart rate noted  Respiratory: Normal respiratory effort, no problems with respiration noted  Abdomen: Soft, gravid, appropriate for gestational age.  Pain/Pressure: Absent     Pelvic: Cervical exam deferred        Extremities: Normal range of motion.  Edema: Trace  Mental Status: Normal mood and affect. Normal behavior. Normal judgment and thought content.   Assessment and Plan:  Pregnancy: [id number] at [redacted]w[redacted]d 1. Supervision of other normal pregnancy,  antepartum -Routine care, no complaints.  -Reivewed pap and recommendations for repeat in 1 year -[facility]- results not available to CNM but patient reports normal and no recommended follow up. Will review results when faxed from [place]  -Patient reports vomiting after eating. She reports feeling some pain after eating and then vomiting. Discussed heartburn in pregnancy including diet changes, medications and OTC management. Patient will try and let office know if she needs RX   2. [redacted] weeks gestation of pregnancy   3. Language barrier affecting health care Spanish interpreter at bedside  Preterm labor symptoms and general obstetric precautions including but not limited to vaginal bleeding, contractions, leaking of fluid and fetal movement were reviewed in detail with the patient. Please refer to After Visit Summary for other counseling recommendations.   Return in about 4 weeks (around 08/25/2022) for Return OB visit.  Future Appointments  Date Time Provider [place]  08/25/2022  9:35 AM Shelly Bombard, MD [place] None    Wende Mott, [place]

## 2022-07-28 NOTE — Progress Notes (Signed)
Pt presents for ROB visit. No concerns at this time.  

## 2022-07-28 NOTE — Patient Instructions (Addendum)
Las medicinas seguras para tomar Solicitor  Safe Medications in Pregnancy  Acn:  Benzoyl Peroxide (Perxido de benzolo)  Salicylic Acid (cido saliclico)  Dolor de espalda/Dolor de cabeza:  Tylenol: 2 pastillas de concentracin regular cada 4 horas O 2 pastillas de concentracin fuerte cada 6 horas  Resfriados/Tos/Alergias:  Benadryl (sin alcohol) 25 mg cada 6 horas segn lo necesite Breath Right strips (Tiras para respirar correctamente)  Claritin  Cepacol (pastillas de chupar para la garganta)  Chloraseptic (aerosol para la garganta)  Cold-Eeze- hasta tres veces por da  Cough drops (pastillas de chupar para la tos, sin alcohol)  Flonase (con receta mdica solamente)  Guaifenesin  Mucinex  Robitussin DM (simple solamente, sin alcohol)  Saline nasal spray/drops (Aerosol nasal salino/gotas) Sudafed (pseudoephedrine) y  Actifed * utilizar slo despus de 12 semanas de gestacin y si no tiene la presin arterial alta.  Tylenol Vicks  VapoRub  Zinc lozenges (pastillas para la garganta)  Zyrtec  Estreimiento:  Colace  Ducolax (supositorios)  Fleet enema (lavado intestinal rectal)  Glycerin (supositorios)  Metamucil  Milk of magnesia (leche de magnesia)  Miralax  Senokot  Smooth Move (t)  Diarrea:  Kaopectate Imodium A-D  *NO tome Pepto-Bismol  Hemorroides:  Anusol  Anusol HC  Preparation H  Tucks  Indigestin:  Tums  Maalox  Mylanta  Zantac  Pepcid  Insomnia:  Benadryl (sin alcohol) 25mg  cada 6 horas segn lo necesite  Tylenol PM  Unisom, no Gelcaps  Calambres en las piernas:  Tums  MagGel Nuseas/Vmitos:  Bonine  Dramamine  Emetrol  Ginger (extracto)  Sea-Bands  Meclizine  Medicina para las nuseas que puede tomar durante el embarazo: Unisom (doxylamine succinate, pastillas de 25 mg) Tome una pastilla al da al [place]. Si los sntomas no estn adecuadamente controlados, la dosis puede aumentarse hasta una dosis mxima recomendada de [employer] al da (1/2 pastilla por la [place], 1/2 pastilla a media tarde y Ardelia Mems pastilla al [place]). Pastillas de Vitamina B6 de 100mg . Tome [employer] veces al da (hasta 200 mg por da).  Erupciones en la piel:  Productos de Aveeno  Benadryl cream (crema o una dosis de 25mg  cada 6 horas segn lo necesite)  Calamine Lotion (locin)  1% cortisone cream (crema de cortisona de 1%)  nfeccin vaginal por hongos (candidiasis):  Gyne-lotrimin 7  Monistat 7   **Si est tomando varias medicinas, por favor revise las etiquetas para Conservation officer, nature los mismos ingredientes [place]. **Tome la medicina segn lo indicado en la etiqueta. **No tome ms de 400 mg de Tylenol en 24 horas. **No tome medicinas que contengan aspirina o ibuprofeno.   AREA PEDIATRIC/FAMILY PRACTICE PHYSICIANS  [place] [phone]) [facility] Erin Hearing, MD; Gwendlyn Deutscher, MD; Walker Kehr, MD; Andria Frames, MD; McDiarmid, MD; Dutch Quint, MD; Nori Riis, MD; Mingo [place], [address] [place], [place] [address] [phone] Mon-Fri 8:30-12:30, 1:30-5:00 Providers come to see babies at [place] [facility] providers who accept newborns: Dorthy Cooler, MD; Orland Mustard, MD; Stephanie Acre, MD [address], [place], [place] [address] [phone] Mon-Fri 8:00-5:30 Babies seen by providers at [facility] Does NOT accept Medicaid Please call early in hospitalization for appointment (limited availability)  [place], MD [address] [place], [place] [address] [phone] Mon, Tue, Thur, Fri 8:30-5:00, Wed 10:00-7:00 (closed 1-2pm) Babies seen by [facility] providers Accepting Medicaid [place], MD [address] [place], [place], [place] [address] [phone] Mon-Fri  8:30-5:00, Sat 8:30-12:00 Provider comes to see babies at [facility] Accepting Medicaid Must have been  referred from current patients or contacted office prior to delivery [place] for Child and [place] ([place] for [place]) Owens Shark, MD; Tamera Punt, MD; Doneen Poisson, MD; Fatima Sanger, MD; Wynetta Emery, MD; Jess Barters, MD; Tami Ribas, MD; Herbert Moors, MD; Derrell Lolling, MD; Dorothyann Peng, MD; Lucious Groves, NP; Baldo Ash, NP [address], [place], [place] [address] [phone] Mon, [place] Poot, Thur, Fri 8:30-5:30, Wed 9:30-5:30, Sat 8:30-12:30 Babies seen by [facility] providers Accepting Medicaid Only accepting infants of first-time parents or siblings of current patients Hospital discharge coordinator will make follow-up appointment Baltazar Najjar [address] [place], [place]  [address] [phone]   Fax - [facility] [address], [place], [place]  [address] Phone - [phone]   Fax - [place] [address], [place], [place], [place]  [address] [phone]  [place] [phone]) [facility] Redmond Baseman, MD; Jacklynn Ganong, MD; Torrie Mayers, MD; MD; Rosana Hoes, MD; Servando Salina, MD; Rose Fillers, MD; Rex Kras, MD; Corinna Capra, MD; Volney American, MD; Trilby Drummer, MD; Janann Colonel, MD; Jimmye [place], [place], [place], [place] [address] [phone] Mon-Fri 8:30-5:00 (extended evenings Mon-Thur as needed), Sat-Sun 10:00-1:00 Providers come to see babies at [facility] Accepting Medicaid for families of first-time babies and families with all children in the household age 17 and under. Must register with office prior to making appointment (M-F only). [place], NP; Tomi Bamberger, MD; Redmond School, MD; [place], [place] [address] [place], [place] [address] [phone] Mon-Fri 8:00-5:00 Babies seen by providers at [facility] Does NOT accept Medicaid/Commercial Insurance Only [facility] at [place] ([facility])  Sabino Gasser, MD; Drema Dallas, MD; Montine Circle, MD; Vilma Prader, MD; Vanita Panda, MD; Alfonso Ramus, MD;  Ruthann Cancer, MD; Roxanne Mins, MD; Rosalva Ferron, MD; Polly Cobia, MD [address] [place], [place] [address] [phone] Mon-Fri 8:30-5:30, Sat (Oct.-Mar.) 9:00-1:00 Babies seen by providers at [place] [phone]) Girard, MD; Suzan Slick, MD [address], [place], [place] [address] [phone] Mon-Fri 8:30-5:00, Sat 8:30-12:00 Providers come to see babies at [facility] Does NOT accept Medicaid [place] at Arlina [place], [place]; Mannie Stabile, MD; [place], [place]; Nancy Fetter, MD; Moreen Fowler, [place], [place], [place] [address] [phone] Mon-Fri 8:00-5:00 Babies seen by providers at [facility] Does NOT accept Medicaid Only accepting babies of parents who are patients Please call early in hospitalization for appointment (limited availability) [facility] Carlis Abbott, MD; Sharlene Motts, MD; Rod Can, MD; Warner Mccreedy, NP; Sabra Heck, MD; Ermalinda Memos, MD; Sharlett Iles, NP; Aurther Loft, MD; Jerrye Beavers, MD; Marcello Moores, MD; Berline Lopes, MD; Charolette Forward, MD [address] [place], [place], [place] [address] [phone] Mon-Fri 8:00-5:00, Sat 9:00-12:00 Providers come to see babies at [facility] Does NOT accept [facility] [phone]) [place] [facility] [place] providers accepting new patients: Dayna Ramus, NP; Rolland Porter, [place], [place], [place] [address] [phone] Mon-Fri 8:00-5:00 Babies seen by providers at [facility] Does NOT accept Medicaid Only accepting babies of parents who are patients Please call early in hospitalization for appointment (limited availability) [facility] Abner Greenspan, MD; Sheran Lawless, MD [address] [place], [place] [address] [phone] (press 1 to schedule appointment) Mon-Fri 8:00-5:00 Providers come to see babies at [facility] Does NOT accept [facility], MD [address], [place], [place]  [address] [phone] Mon-Fri 8:30-5:00 (lunch 12:30-1:00), extended hours by appointment only Wed 5:00-6:30 Babies seen by [facility] providers Accepting Medicaid  [facility] [employer], MD; [place], MD; Hassan Rowan, MD [address] [place], [place], [place] [address] [phone] Mon-Fri 8:00-5:00 Babies seen by [facility] providers Does NOT accept Medicaid [facility] Boykin Peek, MD; Durene Cal, MD; [place], [place] [address] [place], [place] [address] [phone] Mon-Fri 8:00-5:00 Babies seen by [facility] providers Does NOT accept [facility] [place], [place]; [place], [place]; [place], [place]; Avis Epley, MD; Vonna Kotyk, MD; Clance Boll, MD; Stevphen [place], NP; Arvilla Market, NP; Ann Maki, NP; Otis Dials, NP; Vaughan Basta, MD; [place], MD [address] [place], [place] [address] [phone] Mon-Fri 8:30-5:00, Sat 10:00-1:00 Providers come to see babies at [facility] Does NOT accept Medicaid Free prenatal information session Tuesdays at 4:45pm [facility] [place], MD; [place], [place]; [place], [place]; [place], [place] [address] [place] [address] [phone] Mon-Fri 7:30-5:30 Babies seen by [facility] providers [facility] Doctor [address], [place], [place]  [address] [phone]   Fax - [phone]  [place] [phone] & [phone]) [facility], MD [address] [place], [place] [address] [phone] Mon-Thur 8:00-6:00 Providers come to see babies at [facility] Accepting Medicaid [facility] Dareen Piano, NP; Cyndia Bent, MD; [place], [place]; [place], [place] [address] [place], [place] [address] [phone] Mon-Thur 7:30-7:30, Fri 7:30-4:30 Babies seen by [facility] providers Accepting [facility] Juanito Doom, MD; Janene Harvey, NP; Vonita Moss, MD [address], [place], [place] [address] [phone] Mon-Fri  8:30-5:00, Sat 8:30-12:00 Providers come to see babies at [facility] Accepting Medicaid Must have "Meet & Greet" appointment at office prior to delivery [facility] [place] ([facility] [place]) Marlow Baars, MD; Earlene Plater, MD; Lucretia Roers, MD [address], [place], [place] [address] [phone] Mon-Wed 8:00-6:00, Thur-Fri 8:00-5:00, Sat 9:00-12:00 Providers come to see babies at [facility] Does NOT accept Medicaid Only accepting siblings of current patients [facility]  [address], [place], [place]  [address] [phone]   Fax - [phone] [facility] at [facility] [address] [place], [place]  [address] [phone]   Fax - [phone]  [place] [phone] & [phone]) Adult nurse HealthCare at [facility] [place], [place]; [place], [place] [address] [place], [place] [address] [phone] Mon-Fri 7:00-5:00 Babies seen by [facility] providers Does NOT accept Medicaid [facility] [place], MD; [place], [place]; [place], [place] [address], [place], [place] [address] [phone] Mon-Fri 8:00-5:00 Babies seen by [facility] providers Accepting [facility] - [place], MD; [place], [place]; [place], NP; [place], [place] [address] [place], [place], [place] [address] [phone] Mon-Fri 8:00-5:00 Babies seen by providers at [facility] Accepting [facility] [place] [phone]) [facility] Primary Care at [facility] [place], [place] [address] [place], [place] [address] [phone] Mon-Fri 8:00-5:00 Babies seen by [facility] providers Does NOT accept Medicaid Limited availability, please call early in hospitalization to schedule follow-up [facility] Jeanelle [place], [place]; Eddie Candle, MD; Normand Sloop, MD; [place], [place]; Constance Goltz, MD; [place], [place] [address]  [facility] [address], [place], [place] [address] [phone] Mon-Fri 8:30-5:00, Sat 9:00-12:00 Babies seen by providers at [facility] Accepting Medicaid Please register online then schedule online or call office [email or web address] [facility] - [facility] at [facility]) Durene Cal, NP; Lucianne Muss, MD; [place], [place] [address], [place], [place] [address] [phone] Mon-Fri 8:00-5:00 Babies seen by providers at [facility] Accepting Medicaid  [place] ([facility] [employer]) [place], MD; Rayvon Char, NP; Melina Modena, MD [address] [place], [place], [place] [address] [phone] Mon-Fri 8:00-5:30, Sat&Sun by appointment (phones open at 8:30) Babies seen by [facility] providers Accepting Medicaid Must be a first-time baby or sibling of current patient [place]  [address], [place], [place]  [address] [phone]   Fax - [phone]  High [address] [phone] & [phone]) [place], [place]; [place], [place]; [place], MD; [place], [place]; Harrell Lark, MD [address], [place], [place] [address] [phone] Mon-Thur 8:00-7:00, Fri 8:00-5:00, Sat 8:00-12:00, Sun 9:00-12:00 Babies seen by [facility] providers Accepting Medicaid [facility] [place] at [employer]key College, MD; Ruthann Cancer, MD; [facility], MD [address], [place], [place] [address] [phone] Mon-Thur 8:00-5:00 Babies seen by providers at [facility] Accepting Medicaid [facility] [place] at Magdalene Molly, MD; [place], MD; Amedeo Plenty, MD; Bobby Rumpf, MD; List, MD; Lavonia Drafts, MD; Ruthann Cancer, MD; Selinda Eon, MD; Audie Box, MD; Jim Like, MD; Christie Nottingham, MD; Hubbard Hartshorn, MD; Modena Nunnery, MD [address] Barbara Cower [place], [place] [address] [phone] Mon-Fri 8:00-5:30, Sat (Oct.-Mar.) 9:00-1:00 Babies seen by providers at [facility] Accepting Medicaid Must fill out new patient packet, available online at [email or web address] [place] ([facility]) Fredderick Severance, NP; [place]on Kingfisher, NP; Claiborne Billings, NP; Rolla Plate, MD; [place], [place]; Carola Rhine, MD; [place], MD; Delia Chimes, NP [address], [place], [place] [address] [phone] Mon-Thur 8:00-5:30, Fri 8:00-5:00 Babies seen by providers at [place] [phone]) [place], [place]; [place], MD; [place], MD; [place], [place] [address] [place], [place] [address] [phone] Mon-Fri 8:00-5:00 Babies seen by providers at [place] [phone]) [place] [facility], [place]; Olen Pel, MD; [place], [address], [place], [place] [address] [phone] Mon-Fri 8:00-5:00 Babies seen by providers at [facility] Does NOT accept Medicaid Limited appointment availability, please call early in hospitalization  [place] at [facility], [place]; [place], MD [address], [place], [place] [address] [phone] Mon-Fri 8:00-5:00 Babies seen by [facility] providers Does NOT accept Medicaid [place], MD; Guy Sandifer, MD; [place], [place]; [place], [address], [place], [place] [address] [phone] Mon-Fri 8:00-5:00 After hours clinic [facility] [place], [place] [address]) [phone] Mon-Fri 5:00-8:00, Sat 12:00-6:00, Sun 10:00-4:00 Babies seen by [facility] providers Accepting Medicaid [place] [facility] [address] [place], [place]  [address] [phone]   Fax - [phone]  [place] [phone]) [place] at [facility], [address] [place], [place],  [address] [phone] Mon-Fri 8:00-5:00 Babies seen by [facility] providers Does NOT accept Medicaid [place] ([place] at [place]) Daron Offer, [place] [address], [place],  [address] [phone] Mon-Thur 8:00-7:00, Fri 8:00-5:00, Sat 8:00-12:00 Babies seen by providers at [facility] Accepting Medicaid - but does not have vaccinations in office (must be received elsewhere) Limited availability, please call early in hospitalization  [place] [phone]) [place], MD [address] [place] [address] [phone]  Fax [phone]  [facility]  Lauretta [place], MD, [place], [place], [place], [place] [address], [place],  [place] [address] [phone] [facility]  [address] [place], [place], [place] [address] [phone] [address], [place], [place] [address] [phone] [facility] Office)  [facility] [address], [place], [place] [address] [phone] [facility] [address] [place], [place], [place] [address] [phone] [facility] [address], [place], [place] [address] [phone] [facility] [address] [place], [place] [address] [phone] [facility] [address], [place], [place] [address] [phone] [facility] [address], [place], [place] [address] [phone] [facility]s  [address] [place], [place] [address] [phone] Dr. Belia Heman. [place] [address] [place], [place] [address] [phone] [facility] [address], [address], [place], [place] [address] [phone] [facility] [address] [place], [place] [address] [phone]

## 2022-08-25 ENCOUNTER — Encounter: Payer: Self-pay | Admitting: Obstetrics

## 2022-08-25 ENCOUNTER — Ambulatory Visit (INDEPENDENT_AMBULATORY_CARE_PROVIDER_SITE_OTHER): Payer: Self-pay | Admitting: Obstetrics

## 2022-08-25 VITALS — BP 91/60 | HR 91 | Wt 198.0 lb

## 2022-08-25 DIAGNOSIS — Z3480 Encounter for supervision of other normal pregnancy, unspecified trimester: Secondary | ICD-10-CM

## 2022-08-25 DIAGNOSIS — Z3482 Encounter for supervision of other normal pregnancy, second trimester: Secondary | ICD-10-CM

## 2022-08-25 DIAGNOSIS — Z3A24 24 weeks gestation of pregnancy: Secondary | ICD-10-CM

## 2022-08-25 DIAGNOSIS — Z23 Encounter for immunization: Secondary | ICD-10-CM

## 2022-08-25 DIAGNOSIS — Z789 Other specified health status: Secondary | ICD-10-CM

## 2022-08-25 NOTE — Progress Notes (Signed)
Pt presents for ROB.  Desires flu vaccine.  Reports abdominal pain.

## 2022-08-25 NOTE — Progress Notes (Signed)
Subjective:  Valerie Erickson is a 22 y.o. G3P0020 at 28w3dbeing seen today for ongoing prenatal care.  She is currently monitored for the following issues for this low-risk pregnancy and has Encounter for annual physical exam; History of spontaneous abortion; Anxiety and depression; Patellar sleeve fracture of right knee, open type I or II, with delayed healing, subsequent encounter; Need for follow-up by social worker; Immigrant with language difficulty; Closed fracture of shaft of right femur with routine healing; Shortness of breath; Generalized anxiety disorder; Gastroesophageal reflux disease; Supervision of other normal pregnancy, antepartum; and Abnormal Pap smear of cervix on their problem list.  Patient reports heartburn.  Contractions: Irritability. Vag. Bleeding: None.  Movement: Present. Denies leaking of fluid.   The following portions of the patient's history were reviewed and updated as appropriate: allergies, current medications, past family history, past medical history, past social history, past surgical history and problem list. Problem list updated.  Objective:   Vitals:   08/25/22 0947  BP: 91/60  Pulse: 91  Weight: 198 lb (89.8 kg)    Fetal Status:     Movement: Present     General:  Alert, oriented and cooperative. Patient is in no acute distress.  Skin: Skin is warm and dry. No rash noted.   Cardiovascular: Normal heart rate noted  Respiratory: Normal respiratory effort, no problems with respiration noted  Abdomen: Soft, gravid, appropriate for gestational age. Pain/Pressure: Present     Pelvic:  Cervical exam deferred        Extremities: Normal range of motion.  Edema: Trace  Mental Status: Normal mood and affect. Normal behavior. Normal judgment and thought content.   Urinalysis:      Assessment and Plan:  Pregnancy: G3P0020 at 229w3d1. Supervision of other normal pregnancy, antepartum  2. Language barrier affecting health care   Preterm labor  symptoms and general obstetric precautions including but not limited to vaginal bleeding, contractions, leaking of fluid and fetal movement were reviewed in detail with the patient. Please refer to After Visit Summary for other counseling recommendations.   Return in about 4 weeks (around 09/22/2022) for ROB, 2 hour OGTT.   HaShelly BombardMD 08/25/22

## 2022-09-21 NOTE — Progress Notes (Unsigned)
   PRENATAL VISIT NOTE  Subjective:  Valerie Erickson is a 22 y.o. G3P0020 at [redacted]w[redacted]d being seen today for ongoing prenatal care.  She is currently monitored for the following issues for this {Blank single:19197::"high-risk","low-risk"} pregnancy and has Encounter for annual physical exam; History of spontaneous abortion; Anxiety and depression; Patellar sleeve fracture of right knee, open type I or II, with delayed healing, subsequent encounter; Need for follow-up by social worker; Immigrant with language difficulty; Closed fracture of shaft of right femur with routine healing; Shortness of breath; Generalized anxiety disorder; Gastroesophageal reflux disease; Supervision of other normal pregnancy, antepartum; and Abnormal Pap smear of cervix on their problem list.  Patient reports {sx:14538}.   .  .   . Denies leaking of fluid.   The following portions of the patient's history were reviewed and updated as appropriate: allergies, current medications, past family history, past medical history, past social history, past surgical history and problem list.   Objective:  There were no vitals filed for this visit.  Fetal Status:           General:  Alert, oriented and cooperative. Patient is in no acute distress.  Skin: Skin is warm and dry. No rash noted.   Cardiovascular: Normal heart rate noted  Respiratory: Normal respiratory effort, no problems with respiration noted  Abdomen: Soft, gravid, appropriate for gestational age.        Pelvic: {Blank single:19197::"Cervical exam performed in the presence of a chaperone","Cervical exam deferred"}        Extremities: Normal range of motion.     Mental Status: Normal mood and affect. Normal behavior. Normal judgment and thought content.   Assessment and Plan:  Pregnancy: [id number] at [redacted]w[redacted]d 1. Supervision of other normal pregnancy, antepartum ***  2. Language barrier affecting health care ***  3. [redacted] weeks gestation of pregnancy ***  {Blank  single:19197::"Term","Preterm"} labor symptoms and general obstetric precautions including but not limited to vaginal bleeding, contractions, leaking of fluid and fetal movement were reviewed in detail with the patient. Please refer to After Visit Summary for other counseling recommendations.   No follow-ups on file.  Future Appointments  Date Time Provider [place]  09/22/2022  8:00 AM [facility] None  09/22/2022  8:55 AM Leftwich-Kirby, Kathie Dike, CNM [facility] None    Fatima Blank, CNM

## 2022-09-22 ENCOUNTER — Other Ambulatory Visit: Payer: Self-pay

## 2022-09-22 ENCOUNTER — Ambulatory Visit (INDEPENDENT_AMBULATORY_CARE_PROVIDER_SITE_OTHER): Payer: Self-pay | Admitting: Licensed Clinical Social Worker

## 2022-09-22 ENCOUNTER — Encounter: Payer: Self-pay | Admitting: Advanced Practice Midwife

## 2022-09-22 ENCOUNTER — Ambulatory Visit (INDEPENDENT_AMBULATORY_CARE_PROVIDER_SITE_OTHER): Payer: Self-pay | Admitting: Advanced Practice Midwife

## 2022-09-22 VITALS — BP 108/68 | HR 94 | Wt 197.6 lb

## 2022-09-22 DIAGNOSIS — Z3A28 28 weeks gestation of pregnancy: Secondary | ICD-10-CM

## 2022-09-22 DIAGNOSIS — Z789 Other specified health status: Secondary | ICD-10-CM

## 2022-09-22 DIAGNOSIS — F4321 Adjustment disorder with depressed mood: Secondary | ICD-10-CM

## 2022-09-22 DIAGNOSIS — Z3480 Encounter for supervision of other normal pregnancy, unspecified trimester: Secondary | ICD-10-CM

## 2022-09-22 DIAGNOSIS — Z1331 Encounter for screening for depression: Secondary | ICD-10-CM

## 2022-09-22 DIAGNOSIS — F32A Depression, unspecified: Secondary | ICD-10-CM

## 2022-09-22 DIAGNOSIS — O99343 Other mental disorders complicating pregnancy, third trimester: Secondary | ICD-10-CM

## 2022-09-22 DIAGNOSIS — O99342 Other mental disorders complicating pregnancy, second trimester: Secondary | ICD-10-CM

## 2022-09-22 MED ORDER — SERTRALINE HCL 50 MG PO TABS: 50.0000 mg | ORAL_TABLET | Freq: Every day | ORAL | 2 refills | Status: AC

## 2022-09-22 MED ORDER — SERTRALINE HCL 25 MG PO TABS: 25.0000 mg | ORAL_TABLET | Freq: Every day | ORAL | 0 refills | Status: DC

## 2022-09-22 NOTE — Progress Notes (Signed)
Pt present for ROB and GTT. Letter given for TDap. Behavioral health consult

## 2022-09-23 LAB — RPR: RPR Ser Ql: NONREACTIVE

## 2022-09-23 LAB — GLUCOSE TOLERANCE, 2 HOURS W/ 1HR
Glucose, 1 hour: 157 mg/dL (ref 70–179)
Glucose, 2 hour: 89 mg/dL (ref 70–152)
Glucose, Fasting: 78 mg/dL (ref 70–91)

## 2022-09-23 LAB — CBC
Hematocrit: 36.4 % (ref 34.0–46.6)
Hemoglobin: 12.4 g/dL (ref 11.1–15.9)
MCH: 31.8 pg (ref 26.6–33.0)
MCHC: 34.1 g/dL (ref 31.5–35.7)
MCV: 93 fL (ref 79–97)
Platelets: 239 10*3/uL (ref 150–450)
RBC: 3.9 x10E6/uL (ref 3.77–5.28)
RDW: 12.7 % (ref 11.7–15.4)
WBC: 8.4 10*3/uL (ref 3.4–10.8)

## 2022-09-23 LAB — HIV ANTIBODY (ROUTINE TESTING W REFLEX): HIV Screen 4th Generation wRfx: NONREACTIVE

## 2022-09-28 NOTE — BH Specialist Note (Signed)
Integrated Behavioral Health Initial In-Person Visit  MRN: [id number] Name: Valerie Erickson  Number of [place] Clinician visits: 1 Session Start time:   1030am Session End time: 1045am Total time in minutes: 15 mins in person at [facility]   Types of Service: [place] ([facility])  Interpretor:Yes.   Interpretor Name and Language: spanish    Warm Hand Off Completed.        Subjective: Valerie Erickson is a 22 y.o. female accompanied by n/a Patient was referred by L Leftwich-Kirby for depression. Patient reports the following symptoms/concerns: social isolation, depressed mood most days, difficulty staying asleep, and tearful  Duration of problem: approx 5 months ; Severity of problem: mild  Objective: Mood: sad and Affect: Tearful Risk of harm to self or others: No plan to harm self or others  Life Context: Family and Social: lives in [place]  School/Work: employed Self-Care: n/a Life Changes: first pregnancy  Patient and/or Family's Strengths/Protective Factors: Concrete supports in place (healthy food, safe environments, etc.)  Goals Addressed: Patient will: Reduce symptoms of: depression Increase knowledge and/or ability of: coping skills  Demonstrate ability to: Increase healthy adjustment to current life circumstances  Progress towards Goals: Ongoing  Interventions: Interventions utilized: Supportive Counseling  Standardized Assessments completed: Not Needed  Patient and/or Family Response: [name] reports unexplained sadness and crying. [name] reports family is supportive.   Assessment: Patient currently experiencing adjustment disorder with depressed mood.   Patient may benefit from integrated behavioral health.  Plan: Follow up with behavioral health clinician on : next ob visit  Behavioral recommendations: Communicate needs with family for added support, counteract negative thoughts,  prioritize rest. Referral(s): [place] (In Clinic) "From scale of 1-10, how likely are you to follow plan?":    Lynnea Ferrier, LCSW

## 2022-10-06 ENCOUNTER — Encounter: Payer: Self-pay | Admitting: Obstetrics

## 2022-10-06 ENCOUNTER — Ambulatory Visit (INDEPENDENT_AMBULATORY_CARE_PROVIDER_SITE_OTHER): Payer: Self-pay | Admitting: Obstetrics

## 2022-10-06 VITALS — BP 102/60 | HR 77 | Wt 196.9 lb

## 2022-10-06 DIAGNOSIS — Z758 Other problems related to medical facilities and other health care: Secondary | ICD-10-CM

## 2022-10-06 DIAGNOSIS — F32A Depression, unspecified: Secondary | ICD-10-CM

## 2022-10-06 DIAGNOSIS — Z603 Acculturation difficulty: Secondary | ICD-10-CM

## 2022-10-06 DIAGNOSIS — Z3A30 30 weeks gestation of pregnancy: Secondary | ICD-10-CM

## 2022-10-06 DIAGNOSIS — Z3480 Encounter for supervision of other normal pregnancy, unspecified trimester: Secondary | ICD-10-CM

## 2022-10-06 DIAGNOSIS — O99343 Other mental disorders complicating pregnancy, third trimester: Secondary | ICD-10-CM

## 2022-10-06 NOTE — Progress Notes (Signed)
Pt presents for ROB without c/o today.

## 2022-10-06 NOTE — Progress Notes (Signed)
Subjective:  Valerie Erickson is a 22 y.o. G3P0020 at [redacted]w[redacted]d being seen today for ongoing prenatal care.  She is currently monitored for the following issues for this low-risk pregnancy and has Encounter for annual physical exam; History of spontaneous abortion; Anxiety and depression; Patellar sleeve fracture of right knee, open type I or II, with delayed healing, subsequent encounter; Need for follow-up by social worker; Immigrant with language difficulty; Closed fracture of shaft of right femur with routine healing; Shortness of breath; Generalized anxiety disorder; Gastroesophageal reflux disease; Supervision of other normal pregnancy, antepartum; Abnormal Pap smear of cervix; and Positive screening for depression on 9-item Patient Health Questionnaire (PHQ-9) on their problem list.  Patient reports no complaints.  Contractions: Not present. Vag. Bleeding: None.  Movement: Present. Denies leaking of fluid.   The following portions of the patient's history were reviewed and updated as appropriate: allergies, current medications, past family history, past medical history, past social history, past surgical history and problem list. Problem list updated.  Objective:   Vitals:   10/06/22 1011  BP: 102/60  Pulse: 77  Weight: 196 lb 14.4 oz (89.3 kg)    Fetal Status: Fetal Heart Rate (bpm): 144   Movement: Present     General:  Alert, oriented and cooperative. Patient is in no acute distress.  Skin: Skin is warm and dry. No rash noted.   Cardiovascular: Normal heart rate noted  Respiratory: Normal respiratory effort, no problems with respiration noted  Abdomen: Soft, gravid, appropriate for gestational age. Pain/Pressure: Absent     Pelvic:  Cervical exam deferred        Extremities: Normal range of motion.  Edema: None  Mental Status: Normal mood and affect. Normal behavior. Normal judgment and thought content.   Urinalysis:      Assessment and Plan:  Pregnancy: G3P0020 at  [redacted]w[redacted]d  1. Supervision of other normal pregnancy, antepartum  2. Language barrier affecting health care - interpreter present  3. Depression affecting pregnancy - clinically stable  Preterm labor symptoms and general obstetric precautions including but not limited to vaginal bleeding, contractions, leaking of fluid and fetal movement were reviewed in detail with the patient. Please refer to After Visit Summary for other counseling recommendations.   Return in about 1 week (around 10/13/2022) for ROB.   Brock Bad, MD 10/06/22

## 2022-10-07 ENCOUNTER — Encounter: Payer: Self-pay | Admitting: Obstetrics

## 2022-10-13 ENCOUNTER — Ambulatory Visit (INDEPENDENT_AMBULATORY_CARE_PROVIDER_SITE_OTHER): Payer: Self-pay | Admitting: Obstetrics and Gynecology

## 2022-10-13 VITALS — BP 105/69 | HR 82 | Wt 196.0 lb

## 2022-10-13 DIAGNOSIS — Z3480 Encounter for supervision of other normal pregnancy, unspecified trimester: Secondary | ICD-10-CM

## 2022-10-13 DIAGNOSIS — Z603 Acculturation difficulty: Secondary | ICD-10-CM

## 2022-10-13 DIAGNOSIS — Z3A31 31 weeks gestation of pregnancy: Secondary | ICD-10-CM

## 2022-10-13 NOTE — Progress Notes (Signed)
ROB 31.[redacted] wks GA Passed GTT Reports "hip pain", Advised to purchase pregnancy support belt OTC

## 2022-10-13 NOTE — Progress Notes (Signed)
   PRENATAL VISIT NOTE  Subjective:  Valerie Erickson is a 22 y.o. G3P0020 at [redacted]w[redacted]d being seen today for ongoing prenatal care.  She is currently monitored for the following issues for this low-risk pregnancy and has Encounter for annual physical exam; History of spontaneous abortion; Anxiety and depression; Patellar sleeve fracture of right knee, open type I or II, with delayed healing, subsequent encounter; Need for follow-up by social worker; Immigrant with language difficulty; Closed fracture of shaft of right femur with routine healing; Shortness of breath; Generalized anxiety disorder; Gastroesophageal reflux disease; Supervision of other normal pregnancy, antepartum; Abnormal Pap smear of cervix; and Positive screening for depression on 9-item Patient Health Questionnaire (PHQ-9) on their problem list.  Patient doing well with no acute concerns today. She reports  back and hip discomfort .  Contractions: Not present. Vag. Bleeding: None.  Movement: Present. Denies leaking of fluid.   The following portions of the patient's history were reviewed and updated as appropriate: allergies, current medications, past family history, past medical history, past social history, past surgical history and problem list. Problem list updated.  Objective:   Vitals:   10/13/22 1048  BP: 105/69  Pulse: 82  Weight: 196 lb (88.9 kg)    Fetal Status: Fetal Heart Rate (bpm): 147 Fundal Height: 31 cm Movement: Present     General:  Alert, oriented and cooperative. Patient is in no acute distress.  Skin: Skin is warm and dry. No rash noted.   Cardiovascular: Normal heart rate noted  Respiratory: Normal respiratory effort, no problems with respiration noted  Abdomen: Soft, gravid, appropriate for gestational age.  Pain/Pressure: Absent     Pelvic: Cervical exam deferred        Extremities: Normal range of motion.  Edema: None  Mental Status:  Normal mood and affect. Normal behavior. Normal judgment and  thought content.   Assessment and Plan:  Pregnancy: G3P0020 at [redacted]w[redacted]d  1. [redacted] weeks gestation of pregnancy   2. Supervision of other normal pregnancy, antepartum Continue routine prenatal care  3. Immigrant with language difficulty Teleinterpreter utilized  Preterm labor symptoms and general obstetric precautions including but not limited to vaginal bleeding, contractions, leaking of fluid and fetal movement were reviewed in detail with the patient.  Please refer to After Visit Summary for other counseling recommendations.   Return in about 2 weeks (around 10/27/2022) for ROB, in person.   Mariel Aloe, MD Faculty Attending Center for [facility]

## 2022-10-19 ENCOUNTER — Encounter (HOSPITAL_COMMUNITY): Payer: Self-pay | Admitting: Family Medicine

## 2022-10-19 ENCOUNTER — Other Ambulatory Visit: Payer: Self-pay

## 2022-10-19 ENCOUNTER — Inpatient Hospital Stay (HOSPITAL_COMMUNITY)
Admission: AD | Admit: 2022-10-19 | Discharge: 2022-10-19 | Disposition: A | Discharge status: Home / Self Care | Payer: Self-pay | Attending: Family Medicine | Admitting: Family Medicine

## 2022-10-19 DIAGNOSIS — R102 Pelvic and perineal pain: Secondary | ICD-10-CM | POA: Insufficient documentation

## 2022-10-19 DIAGNOSIS — Z3A32 32 weeks gestation of pregnancy: Secondary | ICD-10-CM | POA: Insufficient documentation

## 2022-10-19 DIAGNOSIS — O99343 Other mental disorders complicating pregnancy, third trimester: Secondary | ICD-10-CM | POA: Insufficient documentation

## 2022-10-19 DIAGNOSIS — O26893 Other specified pregnancy related conditions, third trimester: Secondary | ICD-10-CM | POA: Insufficient documentation

## 2022-10-19 LAB — URINALYSIS, ROUTINE W REFLEX MICROSCOPIC
Bilirubin Urine: NEGATIVE
Glucose, UA: NEGATIVE mg/dL
Hgb urine dipstick: NEGATIVE
Ketones, ur: NEGATIVE mg/dL
Leukocytes,Ua: NEGATIVE
Nitrite: NEGATIVE
Protein, ur: NEGATIVE mg/dL
Specific Gravity, Urine: 1.018 (ref 1.005–1.030)
pH: 7 (ref 5.0–8.0)

## 2022-10-19 NOTE — MAU Note (Signed)
Valerie Erickson is a 22 y.o. at [redacted]w[redacted]d here in MAU reporting: pelvic pain that began on Friday.  Reports pain worsened last night secondary with increased fetal movement.  Denies VB or LOF.  Endorses +FM. LMP: NA Onset of complaint: last night Pain score: 0 Vitals:   10/19/22 1321  BP: 103/66  Pulse: 86  Resp: 18  Temp: 97.9 F (36.6 C)  SpO2: 96%     FHT:155 bpm Lab orders placed from triage:   UA
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
## 2022-10-27 ENCOUNTER — Ambulatory Visit (INDEPENDENT_AMBULATORY_CARE_PROVIDER_SITE_OTHER): Payer: Self-pay | Admitting: Obstetrics and Gynecology

## 2022-10-27 VITALS — BP 104/67 | HR 84 | Wt 201.0 lb

## 2022-10-27 DIAGNOSIS — Z603 Acculturation difficulty: Secondary | ICD-10-CM

## 2022-10-27 DIAGNOSIS — Z3A33 33 weeks gestation of pregnancy: Secondary | ICD-10-CM

## 2022-10-27 DIAGNOSIS — Z3483 Encounter for supervision of other normal pregnancy, third trimester: Secondary | ICD-10-CM

## 2022-10-27 DIAGNOSIS — Z3480 Encounter for supervision of other normal pregnancy, unspecified trimester: Secondary | ICD-10-CM

## 2022-10-27 NOTE — Progress Notes (Signed)
ROB 33.[redacted] wks GA MAU visit 10/19/22 pelvic pain, resolved No unusual complaints today

## 2022-10-27 NOTE — Progress Notes (Signed)
   PRENATAL VISIT NOTE  Subjective:  Valerie Erickson is a 22 y.o. G3P0020 at [redacted]w[redacted]d being seen today for ongoing prenatal care.  She is currently monitored for the following issues for this low-risk pregnancy and has Encounter for annual physical exam; History of spontaneous abortion; Anxiety and depression; Patellar sleeve fracture of right knee, open type I or II, with delayed healing, subsequent encounter; Need for follow-up by social worker; Immigrant with language difficulty; Closed fracture of shaft of right femur with routine healing; Shortness of breath; Generalized anxiety disorder; Gastroesophageal reflux disease; Supervision of other normal pregnancy, antepartum; Abnormal Pap smear of cervix; and Positive screening for depression on 9-item Patient Health Questionnaire (PHQ-9) on their problem list.  Patient doing well with no acute concerns today. She reports no complaints.  Contractions: Not present. Vag. Bleeding: None.  Movement: Present. Denies leaking of fluid.   The following portions of the patient's history were reviewed and updated as appropriate: allergies, current medications, past family history, past medical history, past social history, past surgical history and problem list. Problem list updated.  Objective:   Vitals:   10/27/22 1035  BP: 104/67  Pulse: 84  Weight: 201 lb (91.2 kg)    Fetal Status: Fetal Heart Rate (bpm): 137 Fundal Height: 33 cm Movement: Present     General:  Alert, oriented and cooperative. Patient is in no acute distress.  Skin: Skin is warm and dry. No rash noted.   Cardiovascular: Normal heart rate noted  Respiratory: Normal respiratory effort, no problems with respiration noted  Abdomen: Soft, gravid, appropriate for gestational age.  Pain/Pressure: Absent     Pelvic: Cervical exam deferred        Extremities: Normal range of motion.  Edema: None  Mental Status:  Normal mood and affect. Normal behavior. Normal judgment and thought  content.   Assessment and Plan:  Pregnancy: G3P0020 at [redacted]w[redacted]d  1. [redacted] weeks gestation of pregnancy   2. Supervision of other normal pregnancy, antepartum Continue routine prenatal care  3. Immigrant with language difficulty Tele interpreter utilized  Preterm labor symptoms and general obstetric precautions including but not limited to vaginal bleeding, contractions, leaking of fluid and fetal movement were reviewed in detail with the patient.  Please refer to After Visit Summary for other counseling recommendations.   Return in about 2 weeks (around 11/10/2022) for ROB, in person.   Mariel Aloe, MD Faculty Attending Center for [facility]

## 2022-11-11 ENCOUNTER — Ambulatory Visit (INDEPENDENT_AMBULATORY_CARE_PROVIDER_SITE_OTHER): Payer: Self-pay | Admitting: Obstetrics

## 2022-11-11 ENCOUNTER — Encounter: Payer: Self-pay | Admitting: Obstetrics

## 2022-11-11 VITALS — BP 105/79 | HR 84 | Wt 202.0 lb

## 2022-11-11 DIAGNOSIS — O99340 Other mental disorders complicating pregnancy, unspecified trimester: Secondary | ICD-10-CM

## 2022-11-11 DIAGNOSIS — Z758 Other problems related to medical facilities and other health care: Secondary | ICD-10-CM

## 2022-11-11 DIAGNOSIS — F32A Depression, unspecified: Secondary | ICD-10-CM

## 2022-11-11 DIAGNOSIS — J029 Acute pharyngitis, unspecified: Secondary | ICD-10-CM

## 2022-11-11 DIAGNOSIS — Z603 Acculturation difficulty: Secondary | ICD-10-CM

## 2022-11-11 DIAGNOSIS — Z3480 Encounter for supervision of other normal pregnancy, unspecified trimester: Secondary | ICD-10-CM

## 2022-11-11 MED ORDER — AMOXICILLIN 500 MG PO CAPS: 500.0000 mg | ORAL_CAPSULE | Freq: Three times a day (TID) | ORAL | 0 refills | Status: DC

## 2022-11-11 NOTE — Addendum Note (Signed)
Addended by: Coral Ceo A on: 11/11/2022 11:13 AM   Modules accepted: Orders

## 2022-11-11 NOTE — Progress Notes (Addendum)
Subjective:  Valerie Erickson is a 22 y.o. G3P0020 at [redacted]w[redacted]d being seen today for ongoing prenatal care.  She is currently monitored for the following issues for this low-risk pregnancy and has Encounter for annual physical exam; History of spontaneous abortion; Anxiety and depression; Patellar sleeve fracture of right knee, open type I or II, with delayed healing, subsequent encounter; Need for follow-up by social worker; Immigrant with language difficulty; Closed fracture of shaft of right femur with routine healing; Shortness of breath; Generalized anxiety disorder; Gastroesophageal reflux disease; Supervision of other normal pregnancy, antepartum; Abnormal Pap smear of cervix; and Positive screening for depression on 9-item Patient Health Questionnaire (PHQ-9) on their problem list.  Patient reports no complaints.  Contractions: Not present. Vag. Bleeding: None.  Movement: Present. Denies leaking of fluid.   The following portions of the patient's history were reviewed and updated as appropriate: allergies, current medications, past family history, past medical history, past social history, past surgical history and problem list. Problem list updated.  Objective:   Vitals:   11/11/22 1029  Weight: 202 lb (91.6 kg)    Fetal Status:     Movement: Present     General:  Alert, oriented and cooperative. Patient is in no acute distress.  Skin: Skin is warm and dry. No rash noted.   Cardiovascular: Normal heart rate noted  Respiratory: Normal respiratory effort, no problems with respiration noted  Abdomen: Soft, gravid, appropriate for gestational age. Pain/Pressure: Absent     Pelvic:  Cervical exam deferred        Extremities: Normal range of motion.  Edema: Trace  Mental Status: Normal mood and affect. Normal behavior. Normal judgment and thought content.   Urinalysis:      Assessment and Plan:  Pregnancy: G3P0020 at [redacted]w[redacted]d  1. Supervision of other normal pregnancy, antepartum  2.  Language barrier affecting health care - interpreter present  3. Pharyngitis, unspecified etiology Rx: - amoxicillin (AMOXIL) 500 MG capsule; Take 1 capsule (500 mg total) by mouth 3 (three) times daily.  Dispense: 15 capsule; Refill: 0  4. Depression affecting pregnancy - clinically stable  . Preterm labor symptoms and general obstetric precautions including but not limited to vaginal bleeding, contractions, leaking of fluid and fetal movement were reviewed in detail with the patient. Please refer to After Visit Summary for other counseling recommendations.   Return in about 2 weeks (around 11/25/2022) for ROB.   Brock Bad, MD 11/11/22

## 2022-11-11 NOTE — Progress Notes (Signed)
ROB 35.[redacted]wks GA Reports coughing and feeling like she has a cold since 11/06/22. Has been taking Robitussin with honey without relief.

## 2022-11-18 ENCOUNTER — Encounter: Payer: Self-pay | Admitting: Obstetrics

## 2022-11-18 ENCOUNTER — Ambulatory Visit (INDEPENDENT_AMBULATORY_CARE_PROVIDER_SITE_OTHER): Payer: Self-pay | Admitting: Obstetrics

## 2022-11-18 ENCOUNTER — Other Ambulatory Visit (HOSPITAL_COMMUNITY)
Admission: RE | Admit: 2022-11-18 | Discharge: 2022-11-18 | Disposition: A | Discharge status: Home / Self Care | Payer: Self-pay | Source: Ambulatory Visit | Attending: Obstetrics | Admitting: Obstetrics

## 2022-11-18 VITALS — BP 104/65 | HR 93 | Wt 203.8 lb

## 2022-11-18 DIAGNOSIS — Z3493 Encounter for supervision of normal pregnancy, unspecified, third trimester: Secondary | ICD-10-CM | POA: Insufficient documentation

## 2022-11-18 DIAGNOSIS — Z3480 Encounter for supervision of other normal pregnancy, unspecified trimester: Secondary | ICD-10-CM

## 2022-11-18 DIAGNOSIS — Z758 Other problems related to medical facilities and other health care: Secondary | ICD-10-CM

## 2022-11-18 DIAGNOSIS — Z3A36 36 weeks gestation of pregnancy: Secondary | ICD-10-CM

## 2022-11-18 DIAGNOSIS — Z603 Acculturation difficulty: Secondary | ICD-10-CM

## 2022-11-18 DIAGNOSIS — F32A Depression, unspecified: Secondary | ICD-10-CM

## 2022-11-18 DIAGNOSIS — O99340 Other mental disorders complicating pregnancy, unspecified trimester: Secondary | ICD-10-CM

## 2022-11-18 NOTE — Progress Notes (Signed)
Subjective:  Valerie Erickson is a 22 y.o. G3P0020 at [redacted]w[redacted]d being seen today for ongoing prenatal care.  She is currently monitored for the following issues for this low-risk pregnancy and has Encounter for annual physical exam; History of spontaneous abortion; Anxiety and depression; Patellar sleeve fracture of right knee, open type I or II, with delayed healing, subsequent encounter; Need for follow-up by social worker; Immigrant with language difficulty; Closed fracture of shaft of right femur with routine healing; Shortness of breath; Generalized anxiety disorder; Gastroesophageal reflux disease; Supervision of other normal pregnancy, antepartum; Abnormal Pap smear of cervix; and Positive screening for depression on 9-item Patient Health Questionnaire (PHQ-9) on their problem list.  Patient reports no complaints.  Contractions: Not present. Vag. Bleeding: None.  Movement: Present. Denies leaking of fluid.   The following portions of the patient's history were reviewed and updated as appropriate: allergies, current medications, past family history, past medical history, past social history, past surgical history and problem list. Problem list updated.  Objective:   Vitals:   11/18/22 1102  Weight: 203 lb 12.8 oz (92.4 kg)    Fetal Status:     Movement: Present     General:  Alert, oriented and cooperative. Patient is in no acute distress.  Skin: Skin is warm and dry. No rash noted.   Cardiovascular: Normal heart rate noted  Respiratory: Normal respiratory effort, no problems with respiration noted  Abdomen: Soft, gravid, appropriate for gestational age. Pain/Pressure: Absent     Pelvic:  Cervical exam deferred        Extremities: Normal range of motion.  Edema: None  Mental Status: Normal mood and affect. Normal behavior. Normal judgment and thought content.   Urinalysis:      Assessment and Plan:  Pregnancy: G3P0020 at [redacted]w[redacted]d  There are no diagnoses linked to this  encounter. Term labor symptoms and general obstetric precautions including but not limited to vaginal bleeding, contractions, leaking of fluid and fetal movement were reviewed in detail with the patient. Please refer to After Visit Summary for other counseling recommendations.   Return in about 1 week (around 11/25/2022) for ROB.   Brock Bad, MD 11/18/22

## 2022-11-18 NOTE — Progress Notes (Signed)
Pt presents for ROB. 36 week labs collected today.  

## 2022-11-20 LAB — CERVICOVAGINAL ANCILLARY ONLY
Bacterial Vaginitis (gardnerella): POSITIVE — AB
Candida Glabrata: NEGATIVE
Candida Vaginitis: NEGATIVE
Chlamydia: NEGATIVE
Comment: NEGATIVE
Comment: NEGATIVE
Comment: NEGATIVE
Comment: NEGATIVE
Comment: NEGATIVE
Comment: NORMAL
Neisseria Gonorrhea: NEGATIVE
Trichomonas: NEGATIVE

## 2022-11-22 LAB — CULTURE, BETA STREP (GROUP B ONLY): Strep Gp B Culture: NEGATIVE

## 2022-11-24 ENCOUNTER — Other Ambulatory Visit: Payer: Self-pay | Admitting: Obstetrics

## 2022-11-24 DIAGNOSIS — B9689 Other specified bacterial agents as the cause of diseases classified elsewhere: Secondary | ICD-10-CM

## 2022-11-24 MED ORDER — METRONIDAZOLE 500 MG PO TABS: 500.0000 mg | ORAL_TABLET | Freq: Two times a day (BID) | ORAL | 2 refills | Status: DC

## 2022-11-25 ENCOUNTER — Ambulatory Visit (INDEPENDENT_AMBULATORY_CARE_PROVIDER_SITE_OTHER): Payer: Self-pay | Admitting: Obstetrics & Gynecology

## 2022-11-25 VITALS — BP 95/65 | HR 82 | Wt 204.0 lb

## 2022-11-25 DIAGNOSIS — Z603 Acculturation difficulty: Secondary | ICD-10-CM

## 2022-11-25 DIAGNOSIS — Z3483 Encounter for supervision of other normal pregnancy, third trimester: Secondary | ICD-10-CM

## 2022-11-25 DIAGNOSIS — Z3480 Encounter for supervision of other normal pregnancy, unspecified trimester: Secondary | ICD-10-CM

## 2022-11-25 DIAGNOSIS — Z3A37 37 weeks gestation of pregnancy: Secondary | ICD-10-CM

## 2022-11-25 NOTE — Progress Notes (Signed)
   PRENATAL VISIT NOTE  Subjective:  Valerie Erickson is a 22 y.o. G3P0020 at [redacted]w[redacted]d being seen today for ongoing prenatal care.  She is currently monitored for the following issues for this low-risk pregnancy and has Encounter for annual physical exam; History of spontaneous abortion; Anxiety and depression; Patellar sleeve fracture of right knee, open type I or II, with delayed healing, subsequent encounter; Need for follow-up by social worker; Immigrant with language difficulty; Closed fracture of shaft of right femur with routine healing; Shortness of breath; Generalized anxiety disorder; Gastroesophageal reflux disease; Supervision of other normal pregnancy, antepartum; Abnormal Pap smear of cervix; and Positive screening for depression on 9-item Patient Health Questionnaire (PHQ-9) on their problem list.  Patient reports occasional contractions.  Contractions: Not present. Vag. Bleeding: None.  Movement: Present. Denies leaking of fluid.   The following portions of the patient's history were reviewed and updated as appropriate: allergies, current medications, past family history, past medical history, past social history, past surgical history and problem list.   Objective:   Vitals:   11/25/22 0841  BP: 95/65  Pulse: 82  Weight: 204 lb (92.5 kg)    Fetal Status: Fetal Heart Rate (bpm): 135   Movement: Present     General:  Alert, oriented and cooperative. Patient is in no acute distress.  Skin: Skin is warm and dry. No rash noted.   Cardiovascular: Normal heart rate noted  Respiratory: Normal respiratory effort, no problems with respiration noted  Abdomen: Soft, gravid, appropriate for gestational age.  Pain/Pressure: Present     Pelvic: Cervical exam deferred        Extremities: Normal range of motion.     Mental Status: Normal mood and affect. Normal behavior. Normal judgment and thought content.   Assessment and Plan:  Pregnancy: G3P0020 at [redacted]w[redacted]d 1. Supervision of other  normal pregnancy, antepartum Doing well  2. Immigrant with language difficulty Spanish interpreter    Term labor symptoms and general obstetric precautions including but not limited to vaginal bleeding, contractions, leaking of fluid and fetal movement were reviewed in detail with the patient. Please refer to After Visit Summary for other counseling recommendations.   Return in about 1 week (around 12/02/2022).  Future Appointments  Date Time Provider Department Center  12/03/2022  8:35 AM Brock Bad, MD [facility] None    Scheryl Darter, MD

## 2022-11-25 NOTE — Progress Notes (Signed)
Pt is curious to know if she would need any other u/s to make sure baby is doing well. Pt had u/s with [facility] in April.

## 2022-12-03 ENCOUNTER — Encounter: Payer: Self-pay | Admitting: Obstetrics

## 2022-12-03 ENCOUNTER — Ambulatory Visit (INDEPENDENT_AMBULATORY_CARE_PROVIDER_SITE_OTHER): Payer: Self-pay | Admitting: Obstetrics

## 2022-12-03 VITALS — BP 108/71 | HR 76 | Wt 207.2 lb

## 2022-12-03 DIAGNOSIS — Z603 Acculturation difficulty: Secondary | ICD-10-CM

## 2022-12-03 DIAGNOSIS — O99340 Other mental disorders complicating pregnancy, unspecified trimester: Secondary | ICD-10-CM

## 2022-12-03 DIAGNOSIS — Z3480 Encounter for supervision of other normal pregnancy, unspecified trimester: Secondary | ICD-10-CM

## 2022-12-03 DIAGNOSIS — F32A Depression, unspecified: Secondary | ICD-10-CM

## 2022-12-03 DIAGNOSIS — Z758 Other problems related to medical facilities and other health care: Secondary | ICD-10-CM

## 2022-12-03 NOTE — Progress Notes (Signed)
Pt reports fetal movement with some contractions. Pt states that she has been unable to complete Metronidazole for BV due to it making her sick.

## 2022-12-03 NOTE — Progress Notes (Signed)
Subjective:  Valerie Erickson is a 22 y.o. G3P0020 at [redacted]w[redacted]d being seen today for ongoing prenatal care.  She is currently monitored for the following issues for this low-risk pregnancy and has Encounter for annual physical exam; History of spontaneous abortion; Anxiety and depression; Patellar sleeve fracture of right knee, open type I or II, with delayed healing, subsequent encounter; Need for follow-up by social worker; Immigrant with language difficulty; Closed fracture of shaft of right femur with routine healing; Shortness of breath; Generalized anxiety disorder; Gastroesophageal reflux disease; Supervision of other normal pregnancy, antepartum; Abnormal Pap smear of cervix; and Positive screening for depression on 9-item Patient Health Questionnaire (PHQ-9) on their problem list.  Patient reports occasional contractions.  Contractions: Irregular. Vag. Bleeding: None.  Movement: Present. Denies leaking of fluid.   The following portions of the patient's history were reviewed and updated as appropriate: allergies, current medications, past family history, past medical history, past social history, past surgical history and problem list. Problem list updated.  Objective:   Vitals:   12/03/22 0854  BP: 108/71  Pulse: 76  Weight: 207 lb 3.2 oz (94 kg)    Fetal Status: Fetal Heart Rate (bpm): 136   Movement: Present     General:  Alert, oriented and cooperative. Patient is in no acute distress.  Skin: Skin is warm and dry. No rash noted.   Cardiovascular: Normal heart rate noted  Respiratory: Normal respiratory effort, no problems with respiration noted  Abdomen: Soft, gravid, appropriate for gestational age. Pain/Pressure: Present     Pelvic:  Cervical exam deferred        Extremities: Normal range of motion.  Edema: None  Mental Status: Normal mood and affect. Normal behavior. Normal judgment and thought content.   Urinalysis:      Assessment and Plan:  Pregnancy: G3P0020 at  [redacted]w[redacted]d  1. Supervision of other normal pregnancy, antepartum  2. Language barrier affecting health care - interpreter present  3. Depression affecting pregnancy - clinically stable on Zoloft   Term labor symptoms and general obstetric precautions including but not limited to vaginal bleeding, contractions, leaking of fluid and fetal movement were reviewed in detail with the patient. Please refer to After Visit Summary for other counseling recommendations.   Return in about 1 week (around 12/10/2022) for ROB.   Brock Bad, MD 12/03/22

## 2022-12-10 ENCOUNTER — Ambulatory Visit (INDEPENDENT_AMBULATORY_CARE_PROVIDER_SITE_OTHER): Payer: Self-pay | Admitting: Obstetrics & Gynecology

## 2022-12-10 VITALS — BP 107/72 | HR 73 | Wt 208.0 lb

## 2022-12-10 DIAGNOSIS — Z3480 Encounter for supervision of other normal pregnancy, unspecified trimester: Secondary | ICD-10-CM

## 2022-12-10 NOTE — Progress Notes (Signed)
Pt states she woke up this morning with upset stomach and vomiting.

## 2022-12-10 NOTE — Progress Notes (Signed)
   PRENATAL VISIT NOTE  Subjective:  Valerie Erickson is a 22 y.o. G3P0020 at [redacted]w[redacted]d being seen today for ongoing prenatal care.  She is currently monitored for the following issues for this low-risk pregnancy and has History of spontaneous abortion; Anxiety and depression; Immigrant with language difficulty; Generalized anxiety disorder; Gastroesophageal reflux disease; Supervision of other normal pregnancy, antepartum; Abnormal Pap smear of cervix; and Positive screening for depression on 9-item Patient Health Questionnaire (PHQ-9) on their problem list.  Patient reports occasional contractions.  Contractions: Not present. Vag. Bleeding: None.  Movement: Present. Denies leaking of fluid.   The following portions of the patient's history were reviewed and updated as appropriate: allergies, current medications, past family history, past medical history, past social history, past surgical history and problem list.   Objective:   Vitals:   12/10/22 0836  BP: 107/72  Pulse: 73  Weight: 208 lb (94.3 kg)    Fetal Status: Fetal Heart Rate (bpm): 140   Movement: Present     General:  Alert, oriented and cooperative. Patient is in no acute distress.  Skin: Skin is warm and dry. No rash noted.   Cardiovascular: Normal heart rate noted  Respiratory: Normal respiratory effort, no problems with respiration noted  Abdomen: Soft, gravid, appropriate for gestational age.  Pain/Pressure: Present     Pelvic: Cervical exam performed in the presence of a chaperone Dilation: 1 Effacement (%): 50 Station: -3  Extremities: Normal range of motion.  Edema: None     Term labor symptoms and general obstetric precautions including but not limited to vaginal bleeding, contractions, leaking of fluid and fetal movement were reviewed in detail with the patient. Please refer to After Visit Summary for other counseling recommendations.   Return in about 5 days (around 12/15/2022). NST AFI that day, IOL 41  weeks No future appointments.  Scheryl Darter, MD

## 2022-12-11 ENCOUNTER — Inpatient Hospital Stay (HOSPITAL_COMMUNITY)
Admission: AD | Admit: 2022-12-11 | Discharge: 2022-12-12 | Disposition: A | Discharge status: Home / Self Care | Payer: Self-pay | Attending: Obstetrics & Gynecology | Admitting: Obstetrics & Gynecology

## 2022-12-11 ENCOUNTER — Encounter (HOSPITAL_COMMUNITY): Payer: Self-pay | Admitting: Obstetrics & Gynecology

## 2022-12-11 DIAGNOSIS — O26893 Other specified pregnancy related conditions, third trimester: Secondary | ICD-10-CM | POA: Insufficient documentation

## 2022-12-11 DIAGNOSIS — Z3A40 40 weeks gestation of pregnancy: Secondary | ICD-10-CM | POA: Insufficient documentation

## 2022-12-11 DIAGNOSIS — R109 Unspecified abdominal pain: Secondary | ICD-10-CM | POA: Insufficient documentation

## 2022-12-11 DIAGNOSIS — O368130 Decreased fetal movements, third trimester, not applicable or unspecified: Secondary | ICD-10-CM | POA: Insufficient documentation

## 2022-12-11 DIAGNOSIS — Z3689 Encounter for other specified antenatal screening: Secondary | ICD-10-CM

## 2022-12-11 NOTE — MAU Provider Note (Signed)
History     CSN: [id number]  Arrival date and time: 12/11/22 2307   Event Date/Time   First Provider Initiated Contact with Patient 12/11/22 2350      Chief Complaint  Patient presents with   Decreased Fetal Movement   Valerie Erickson is a 22 y.o. year old G1P0020 female at [redacted]w[redacted]d weeks gestation who presents to MAU reporting she had not felt the baby move since 1000 this morning and lower abdominal cramping. She states "I have felt 1 movement since I've been here." **Please note below HPI.** She denies any VB or LOF. She receives [facility] with [facility]; next appt is 12/16/2022. Her spouse is present and contributing to the history taking.  **She was given an event marker upon being placed on EFM. She has clicked to mark her detection of FM multiple times.   OB History     Gravida  3   Para  0   Term  0   Preterm  0   AB  2   Living         SAB  2   IAB  0   Ectopic  0   Multiple      Live Births              Past Medical History:  Diagnosis Date   Depression    "has sad days"   H/O one miscarriage    Headache    Heart murmur    "when in [place]"   Ovarian cyst     Past Surgical History:  Procedure Laterality Date   DILATION AND CURETTAGE, DIAGNOSTIC / THERAPEUTIC  11/03/2020   FEMUR FRACTURE SURGERY Right 10/28/2020   KNEE SURGERY Right 10/31/2020   KNEE SURGERY      Family History  Problem Relation Age of Onset   Hyperlipidemia Mother    Kidney disease Father        unsure of specifics   Diabetes Father    Hypertension Father    Cirrhosis Father     Social History   Tobacco Use   Smoking status: Never    Passive exposure: Never   Smokeless tobacco: Never  Vaping Use   Vaping Use: Never used  Substance Use Topics   Alcohol use: Never   Drug use: Never    Allergies: No Known Allergies  Medications Prior to Admission  Medication Sig Dispense Refill Last Dose   folic acid (FOLVITE) 400 MCG tablet Take 400 mcg by mouth  daily.   12/10/2022   Prenatal Vit-Fe Fumarate-FA (PNV PRENATAL PLUS MULTIVITAMIN) 27-1 MG TABS Take 1 tablet by mouth daily before breakfast. 90 tablet 4 12/10/2022   amoxicillin (AMOXIL) 500 MG capsule Take 1 capsule (500 mg total) by mouth 3 (three) times daily. (Patient not taking: Reported on 11/18/2022) 15 capsule 0    metroNIDAZOLE (FLAGYL) 500 MG tablet Take 1 tablet (500 mg total) by mouth 2 (two) times daily. 14 tablet 2    sertraline (ZOLOFT) 25 MG tablet Take 1 tablet (25 mg total) by mouth daily. (Patient not taking: Reported on 10/06/2022) 7 tablet 0    sertraline (ZOLOFT) 50 MG tablet Take 1 tablet (50 mg total) by mouth daily. (Patient not taking: Reported on 10/06/2022) 30 tablet 2     Review of Systems  Constitutional: Negative.   HENT: Negative.    Eyes: Negative.   Respiratory: Negative.    Cardiovascular: Negative.   Gastrointestinal: Negative.   Endocrine: Negative.   Genitourinary:  Positive for pelvic pain (lower abdominal cramping).       DFM  Musculoskeletal: Negative.   Skin: Negative.   Allergic/Immunologic: Negative.   Neurological: Negative.   Hematological: Negative.   Psychiatric/Behavioral: Negative.     Physical Exam   Blood pressure 120/76, pulse 73, temperature 97.6 F (36.4 C), temperature source Oral, resp. rate 18, last menstrual period 04/22/2022, SpO2 99 %, unknown if currently breastfeeding.  Physical Exam Vitals and nursing note reviewed.  Constitutional:      Appearance: Normal appearance. She is normal weight.  Cardiovascular:     Rate and Rhythm: Normal rate.  Pulmonary:     Effort: Pulmonary effort is normal.  Abdominal:     Palpations: Abdomen is soft.  Genitourinary:    Comments: Dilation: 1 Effacement (%): 50 Cervical Position: Posterior Station: -3 Exam by:: Felipa Furnace RN  Musculoskeletal:        General: Normal range of motion.     Cervical back: Normal range of motion and neck supple.  Skin:    General: Skin is warm  and dry.  Neurological:     Mental Status: She is alert and oriented to person, place, and time.  Psychiatric:        Mood and Affect: Mood normal.        Behavior: Behavior normal.        Thought Content: Thought content normal.        Judgment: Judgment normal.    REACTIVE NST - FHR: 150 bpm / moderate variability / accels present / decels absent / TOCO: irregular every 5-8 mins  MAU Course  Procedures  MDM CEFM Tylenol 1000 mg po -- improved pain  Assessment and Plan  1. NST (non-stress test) reactive - Reassurance given that baby is moving a lot more than she is feeling. Informed that baby is audibly moving a lot.  2. [redacted] weeks gestation of pregnancy   - Discharge patient - Keep scheduled appt with [facility] on 12/16/2022 - Patient verbalized an understanding of the plan of care and agrees.   Raelyn Mora, CNM 12/11/2022, 11:50 PM

## 2022-12-11 NOTE — MAU Note (Signed)
.  Valerie Erickson is a 22 y.o. at [redacted]w[redacted]d here in MAU reporting: no fetal movement since 10 am. Reports she has also been feeling some lower abdominal cramping. Denies vaginal bleeding or leaking of fluid.   Pain score: 5/10 Vitals:   12/11/22 2325 12/11/22 2327  BP:  118/66  Pulse:  74  Resp:  18  Temp:  97.6 F (36.4 C)  SpO2: 99%      [id number] in room  Lab orders placed from triage:  none

## 2022-12-12 DIAGNOSIS — Z3689 Encounter for other specified antenatal screening: Secondary | ICD-10-CM

## 2022-12-12 DIAGNOSIS — Z3A40 40 weeks gestation of pregnancy: Secondary | ICD-10-CM

## 2022-12-12 MED ORDER — ACETAMINOPHEN 500 MG PO TABS
1000.0000 mg | ORAL_TABLET | Freq: Once | ORAL | Status: AC
  Administered 2022-12-12: 1000 mg via ORAL
  Filled 2022-12-12: qty 2

## 2022-12-15 ENCOUNTER — Other Ambulatory Visit: Payer: Self-pay

## 2022-12-15 ENCOUNTER — Inpatient Hospital Stay (HOSPITAL_COMMUNITY): Payer: Medicaid Other | Admitting: Anesthesiology

## 2022-12-15 ENCOUNTER — Encounter (HOSPITAL_COMMUNITY): Payer: Self-pay | Admitting: Obstetrics and Gynecology

## 2022-12-15 ENCOUNTER — Inpatient Hospital Stay (HOSPITAL_COMMUNITY)
Admission: AD | Admit: 2022-12-15 | Discharge: 2022-12-17 | DRG: 806 | Disposition: A | Discharge status: Home / Self Care | Payer: Medicaid Other | Attending: Obstetrics & Gynecology | Admitting: Obstetrics & Gynecology

## 2022-12-15 DIAGNOSIS — Z603 Acculturation difficulty: Secondary | ICD-10-CM | POA: Diagnosis present

## 2022-12-15 DIAGNOSIS — O9081 Anemia of the puerperium: Secondary | ICD-10-CM | POA: Diagnosis not present

## 2022-12-15 DIAGNOSIS — Z5986 Financial insecurity: Secondary | ICD-10-CM | POA: Diagnosis not present

## 2022-12-15 DIAGNOSIS — F419 Anxiety disorder, unspecified: Secondary | ICD-10-CM | POA: Diagnosis present

## 2022-12-15 DIAGNOSIS — F411 Generalized anxiety disorder: Secondary | ICD-10-CM | POA: Diagnosis present

## 2022-12-15 DIAGNOSIS — F32A Depression, unspecified: Secondary | ICD-10-CM | POA: Diagnosis present

## 2022-12-15 DIAGNOSIS — O480 Post-term pregnancy: Secondary | ICD-10-CM | POA: Diagnosis present

## 2022-12-15 DIAGNOSIS — D62 Acute posthemorrhagic anemia: Secondary | ICD-10-CM | POA: Diagnosis not present

## 2022-12-15 DIAGNOSIS — O99344 Other mental disorders complicating childbirth: Secondary | ICD-10-CM | POA: Diagnosis present

## 2022-12-15 DIAGNOSIS — Z3A40 40 weeks gestation of pregnancy: Secondary | ICD-10-CM | POA: Diagnosis not present

## 2022-12-15 DIAGNOSIS — Z3480 Encounter for supervision of other normal pregnancy, unspecified trimester: Secondary | ICD-10-CM

## 2022-12-15 LAB — CBC
HCT: 40.6 % (ref 36.0–46.0)
Hemoglobin: 14 g/dL (ref 12.0–15.0)
MCH: 31.6 pg (ref 26.0–34.0)
MCHC: 34.5 g/dL (ref 30.0–36.0)
MCV: 91.6 fL (ref 80.0–100.0)
Platelets: 209 10*3/uL (ref 150–400)
RBC: 4.43 MIL/uL (ref 3.87–5.11)
RDW: 13.3 % (ref 11.5–15.5)
WBC: 9.4 10*3/uL (ref 4.0–10.5)
nRBC: 0 % (ref 0.0–0.2)

## 2022-12-15 LAB — NO BLOOD PRODUCTS

## 2022-12-15 LAB — ANTIBODY SCREEN: Antibody Screen: NEGATIVE

## 2022-12-15 LAB — RPR: RPR Ser Ql: NONREACTIVE

## 2022-12-15 LAB — ABO/RH: ABO/RH(D): O POS

## 2022-12-15 MED ORDER — ACETAMINOPHEN 325 MG PO TABS
650.0000 mg | ORAL_TABLET | ORAL | Status: DC | PRN
  Administered 2022-12-16: 650 mg via ORAL
  Filled 2022-12-15: qty 2

## 2022-12-15 MED ORDER — EPHEDRINE 5 MG/ML INJ: 10.0000 mg | INTRAVENOUS | Status: DC | PRN

## 2022-12-15 MED ORDER — OXYCODONE-ACETAMINOPHEN 5-325 MG PO TABS: 1.0000 | ORAL_TABLET | ORAL | Status: DC | PRN

## 2022-12-15 MED ORDER — PHENYLEPHRINE 80 MCG/ML (10ML) SYRINGE FOR IV PUSH (FOR BLOOD PRESSURE SUPPORT): 80.0000 ug | PREFILLED_SYRINGE | INTRAVENOUS | Status: DC | PRN

## 2022-12-15 MED ORDER — SOD CITRATE-CITRIC ACID 500-334 MG/5ML PO SOLN: 30.0000 mL | ORAL | Status: DC | PRN

## 2022-12-15 MED ORDER — ONDANSETRON HCL 4 MG/2ML IJ SOLN
4.0000 mg | Freq: Four times a day (QID) | INTRAMUSCULAR | Status: DC | PRN
  Administered 2022-12-15: 4 mg via INTRAVENOUS
  Filled 2022-12-15: qty 2

## 2022-12-15 MED ORDER — LACTATED RINGERS IV SOLN: 500.0000 mL | INTRAVENOUS | Status: DC | PRN

## 2022-12-15 MED ORDER — OXYCODONE-ACETAMINOPHEN 5-325 MG PO TABS: 2.0000 | ORAL_TABLET | ORAL | Status: DC | PRN

## 2022-12-15 MED ORDER — OXYTOCIN-SODIUM CHLORIDE 30-0.9 UT/500ML-% IV SOLN
1.0000 m[IU]/min | INTRAVENOUS | Status: DC
  Administered 2022-12-15: 1 m[IU]/min via INTRAVENOUS
  Filled 2022-12-15: qty 500

## 2022-12-15 MED ORDER — LACTATED RINGERS IV SOLN: INTRAVENOUS | Status: DC

## 2022-12-15 MED ORDER — OXYTOCIN-SODIUM CHLORIDE 30-0.9 UT/500ML-% IV SOLN: 2.5000 [IU]/h | INTRAVENOUS | Status: DC

## 2022-12-15 MED ORDER — LACTATED RINGERS IV SOLN: 500.0000 mL | Freq: Once | INTRAVENOUS | Status: DC

## 2022-12-15 MED ORDER — FENTANYL-BUPIVACAINE-NACL 0.5-0.125-0.9 MG/250ML-% EP SOLN
12.0000 mL/h | EPIDURAL | Status: DC | PRN
  Administered 2022-12-16: 12 mL/h via EPIDURAL
  Filled 2022-12-15: qty 250

## 2022-12-15 MED ORDER — FENTANYL CITRATE (PF) 100 MCG/2ML IJ SOLN
50.0000 ug | INTRAMUSCULAR | Status: DC | PRN
  Administered 2022-12-15 (×3): 100 ug via INTRAVENOUS
  Filled 2022-12-15 (×3): qty 2

## 2022-12-15 MED ORDER — DIPHENHYDRAMINE HCL 50 MG/ML IJ SOLN: 12.5000 mg | INTRAMUSCULAR | Status: DC | PRN

## 2022-12-15 MED ORDER — LIDOCAINE HCL (PF) 1 % IJ SOLN: 30.0000 mL | INTRAMUSCULAR | Status: DC | PRN

## 2022-12-15 MED ORDER — OXYTOCIN BOLUS FROM INFUSION
333.0000 mL | Freq: Once | INTRAVENOUS | Status: AC
  Administered 2022-12-16: 333 mL via INTRAVENOUS

## 2022-12-15 MED ORDER — TERBUTALINE SULFATE 1 MG/ML IJ SOLN: 0.2500 mg | Freq: Once | INTRAMUSCULAR | Status: DC | PRN

## 2022-12-15 NOTE — Anesthesia Preprocedure Evaluation (Signed)
Anesthesia Evaluation  Patient identified by MRN, date of birth, ID band Patient awake    Reviewed: Allergy & Precautions, H&P , NPO status , Patient's Chart, lab work & pertinent test results  Airway Mallampati: II  TM Distance: >3 FB Neck ROM: Full    Dental no notable dental hx.    Pulmonary neg pulmonary ROS   Pulmonary exam normal breath sounds clear to auscultation       Cardiovascular negative cardio ROS Normal cardiovascular exam Rhythm:Regular Rate:Normal     Neuro/Psych  Headaches  negative psych ROS   GI/Hepatic Neg liver ROS,GERD  ,,  Endo/Other  negative endocrine ROS    Renal/GU negative Renal ROS  negative genitourinary   Musculoskeletal negative musculoskeletal ROS (+)    Abdominal  (+) + obese  Peds negative pediatric ROS (+)  Hematology negative hematology ROS (+)   Anesthesia Other Findings   Reproductive/Obstetrics (+) Pregnancy                             Anesthesia Physical Anesthesia Plan  ASA: 2  Anesthesia Plan: Epidural   Post-op Pain Management:    Induction: Intravenous  PONV Risk Score and Plan:   Airway Management Planned:   Additional Equipment:   Intra-op Plan:   Post-operative Plan: Extubation in OR  Informed Consent: I have reviewed the patients History and Physical, chart, labs and discussed the procedure including the risks, benefits and alternatives for the proposed anesthesia with the patient or authorized representative who has indicated his/her understanding and acceptance.     Dental advisory given  Plan Discussed with: CRNA  Anesthesia Plan Comments:        Anesthesia Quick Evaluation

## 2022-12-15 NOTE — MAU Note (Signed)
.  Valerie Erickson is a 22 y.o. at [redacted]w[redacted]d here in MAU reporting: contractions that began 0100 every 5 minutes. Denies SROM.  Bloody show reported + Fetal movement  Onset of complaint: 0100 Pain score: 8 Vitals:   12/15/22 0549  BP: 108/64  Pulse: 75  Resp: 18  Temp: 97.9 F (36.6 C)  SpO2: 99%     FHT:125bpm Lab orders placed from triage:  mau labor

## 2022-12-15 NOTE — Progress Notes (Signed)
Labor Progress Note Valerie Erickson is a 22 y.o. G3P0020 at [redacted]w[redacted]d presented for SOL and postdates.  S: Patient has no concerns. Agreeable to pitocin and foley.  O:  BP 128/67   Pulse 72   Temp 97.9 F (36.6 C) (Oral)   Resp 18   Ht 5\' 4"  (1.626 m)   Wt 94.3 kg   LMP 04/22/2022   SpO2 99%   BMI 35.70 kg/m   CVE: Dilation: 1.5 Effacement (%): 50 Cervical Position: Posterior Station: -3 Presentation: Vertex Exam by:: Ndule   A&P: 22 y.o. [id number] [redacted]w[redacted]d here for SOL and postdates. #Labor: Foley placed 1320. Pitocin started 1323.  #Pain: Currently controlled. IV Fentanyl PRN. Patient may request epidural. #FWB: Cat I #GBS negative  Tiffany Kocher, DO 3:03 PM

## 2022-12-15 NOTE — H&P (Addendum)
OBSTETRIC ADMISSION HISTORY AND PHYSICAL  Valerie Erickson is a 22 y.o. female G3P0020 with IUP at [redacted]w[redacted]d by LMP presenting for SOL and postdates. She reports +FMs, No LOF, no VB, no blurry vision, headaches or peripheral edema, and RUQ pain.  She plans on breast feeding. She is undecided on birth control method, considering POPs. She received her prenatal [facility]   Dating: By LMP --->  Estimated Date of Delivery: 12/12/22  Sono:    @[redacted]w[redacted]d , CWD, normal anatomy, cephalic presentation, 302g, 15% EFW   Prenatal History/Complications:  Depression (not taking Zoloft) Abnormal pap smear of cervix  Past Medical History: Past Medical History:  Diagnosis Date   Depression    "has sad days"   H/O one miscarriage    Headache    Heart murmur    "when in [place]"   Ovarian cyst     Past Surgical History: Past Surgical History:  Procedure Laterality Date   DILATION AND CURETTAGE, DIAGNOSTIC / THERAPEUTIC  11/03/2020   FEMUR FRACTURE SURGERY Right 10/28/2020   KNEE SURGERY Right 10/31/2020   KNEE SURGERY      Obstetrical History: OB History     Gravida  3   Para  0   Term  0   Preterm  0   AB  2   Living         SAB  2   IAB  0   Ectopic  0   Multiple      Live Births              Social History Social History   Socioeconomic History   Marital status: Single    Spouse name: Not on file   Number of children: 0   Years of education: Not on file   Highest education level: Not on file  Occupational History   Not on file  Tobacco Use   Smoking status: Never    Passive exposure: Never   Smokeless tobacco: Never  Vaping Use   Vaping Use: Never used  Substance and Sexual Activity   Alcohol use: Never   Drug use: Never   Sexual activity: Not Currently    Birth control/protection: None  Other Topics Concern   Not on file  Social History Narrative   ** Merged History Encounter **       Recent immigration from [place] (October 26, 2020). Living  with family at this time.    Social Determinants of Health   Financial Resource Strain: High Risk (12/02/2020)   Overall Financial Resource Strain (CARDIA)    Difficulty of Paying Living Expenses: Hard  Food Insecurity: No Food Insecurity (12/15/2022)   Hunger Vital Sign    Worried About Running Out of Food in the Last Year: Never true    Ran Out of Food in the Last Year: Never true  Transportation Needs: No Transportation Needs (12/15/2022)   PRAPARE - Administrator, Civil Service (Medical): No    Lack of Transportation (Non-Medical): No  Physical Activity: Not on file  Stress: Not on file  Social Connections: Not on file    Family History: Family History  Problem Relation Age of Onset   Hyperlipidemia Mother    Kidney disease Father        unsure of specifics   Diabetes Father    Hypertension Father    Cirrhosis Father     Allergies: No Known Allergies  Medications Prior to Admission  Medication Sig Dispense Refill Last  Dose   Prenatal Vit-Fe Fumarate-FA (PNV PRENATAL PLUS MULTIVITAMIN) 27-1 MG TABS Take 1 tablet by mouth daily before breakfast. 90 tablet 4 12/14/2022   folic acid (FOLVITE) 400 MCG tablet Take 400 mcg by mouth daily.      sertraline (ZOLOFT) 25 MG tablet Take 1 tablet (25 mg total) by mouth daily. (Patient not taking: Reported on 10/06/2022) 7 tablet 0    sertraline (ZOLOFT) 50 MG tablet Take 1 tablet (50 mg total) by mouth daily. (Patient not taking: Reported on 10/06/2022) 30 tablet 2      Review of Systems   All systems reviewed and negative except as stated in HPI  Blood pressure 128/67, pulse 72, temperature 97.9 F (36.6 C), temperature source Oral, resp. rate 18, height 5\' 4"  (1.626 m), weight 94.3 kg, last menstrual period 04/22/2022, SpO2 99 %, unknown if currently breastfeeding. General appearance: alert, cooperative, and no distress Lungs: clear to auscultation bilaterally Heart: regular rate and rhythm Abdomen: soft, non-tender;  bowel sounds normal Pelvic: See below Extremities: Homans sign is negative, no sign of DVT Presentation: cephalic Fetal monitoringBaseline: 125 bpm, Variability: moderate, Accelerations: Reactive, and Decelerations: Absent Uterine activity Irregular Dilation: 1.5 Effacement (%): 50 Station: -3 Exam by:: Ndule   Prenatal labs: ABO, Rh: --/--/O POS Performed at [facility], [address], [place], [place] [address]  [phone] [id number]) Antibody: NEG Performed at [facility], [address], [place], [place] [address]  [phone]) Rubella: 1.73 (01/02 1341) RPR: NON REACTIVE (06/18 0858)  HBsAg: Negative (01/02 1341)  HIV: Non Reactive (03/26 0817)  GBS: Negative/-- (05/22 1140)  2 hr Glucola normal Genetic screening not completed Anatomy US normal  Prenatal Transfer Tool  Maternal Diabetes: No Genetic Screening: Declined Maternal Ultrasounds/Referrals: Normal Fetal Ultrasounds or other Referrals:  None Maternal Substance Abuse:  No Significant Maternal Medications:  Reports no meds Significant Maternal Lab Results:  Group B Strep negative Number of Prenatal Visits:greater than 3 verified prenatal visits Other Comments:  None  Results for orders placed or performed during the hospital encounter of 12/15/22 (from the past 24 hour(s))  ABO/Rh   Collection Time: 12/15/22  8:50 AM  Result Value Ref Range   ABO/RH(D)      O POS Performed at [facility], [address] [place], [place] [address]   Antibody screen   Collection Time: 12/15/22  8:50 AM  Result Value Ref Range   Antibody Screen      NEG Performed at [facility], [address] [place], [place] [address]   CBC   Collection Time: 12/15/22  8:58 AM  Result Value Ref Range   WBC 9.4 4.0 - 10.5 K/uL   RBC 4.43 3.87 - 5.11 MIL/uL   Hemoglobin 14.0 12.0 - 15.0 g/dL   HCT 62.9 52.8 - 41.3 %   MCV 91.6 80.0 - 100.0 fL   MCH 31.6 26.0 - 34.0 pg   MCHC 34.5 30.0 - 36.0 g/dL   RDW 24.4  01.0 - 27.2 %   Platelets 209 150 - 400 K/uL   nRBC 0.0 0.0 - 0.2 %  RPR   Collection Time: 12/15/22  8:58 AM  Result Value Ref Range   RPR Ser Ql NON REACTIVE NON REACTIVE  No blood products   Collection Time: 12/15/22 10:18 AM  Result Value Ref Range   Transfuse no blood products      TRANSFUSE NO BLOOD PRODUCTS, VERIFIED BY Vernona Rieger EDMONDS, RN Performed at  [facility], [address] [place], [place] [address]     Patient Active Problem List   Diagnosis Date Noted   [redacted] weeks gestation of pregnancy 12/15/2022   Positive screening for depression on 9-item Patient Health Questionnaire (PHQ-9) 09/22/2022   Abnormal Pap smear of cervix 07/28/2022   Supervision of other normal pregnancy, antepartum 06/30/2022   Generalized anxiety disorder 02/03/2021   Gastroesophageal reflux disease 02/03/2021   History of spontaneous abortion 12/02/2020   Anxiety and depression 12/02/2020   Immigrant with language difficulty 12/02/2020    Assessment/Plan:  Dhani Cichosz is a 22 y.o. G3P0020 at [redacted]w[redacted]d here for SOL and postdates.  [facility] in-person Spanish Interpreter present during encounter.  **Requests no blood products, consent signed**  #Labor: Plan for foley balloon and pitocin. AROM as necessary.  #Pain: IV Fentanyl PRN. Patient may request epidural #FWB: Cat I #ID:  GBS Negative #MOF: Breast and bottle #MOC: Undecided, considering POPs #Circ:  N/A  Tiffany Kocher, DO  12/15/2022, 1:27 PM  Fellow Attestation  I saw and evaluated the patient, performing the key elements of the service.I  personally performed or re-performed the history, physical exam, and medical decision making activities of this service and have verified that the service and findings are accurately documented in the resident's note. I developed the management plan that is described in the resident's note, and I agree with the content, with my edits above.   FB inserted, plan is to keep pitocin at max of  6 units until FB out.  Alfredia Ferguson, MD,MPH OB Fellow, Faculty Practice  12/15/2022 3:03 PM

## 2022-12-15 NOTE — Progress Notes (Signed)
Labor Progress Note  Valerie Erickson is a 22 y.o. G3P0020 at [redacted]w[redacted]d presented for SOL and IOL PD  S: pt feeling contractions and considering epidural  O:  BP 111/68   Pulse 75   Temp 98 F (36.7 C) (Oral)   Resp 16   Ht 5\' 4"  (1.626 m)   Wt 94.3 kg   LMP 04/22/2022   SpO2 99%   BMI 35.70 kg/m  EFM:140 bpm/Moderate variability/ 15x15 accels/ None decels CAT: 1 Toco: regular, every 2-3 minutes   CVE: Dilation: 6 Effacement (%): 80 Cervical Position: Anterior Station: -2, -1 Presentation: Vertex Exam by:: Brynda Greathouse, RN   A&P: 22 y.o. [id number] [redacted]w[redacted]d  here for IOl as above  #Labor: Progressing well. Continue pitocin 1x1 #Pain: Family/Friend support, PO/IV pain meds, and Epidural considering #FWB: CAT 1 #GBS negative  Myrtie Hawk, DO FMOB Fellow, Faculty practice [facility], Center for [facility] 12/15/22  10:24 PM

## 2022-12-16 ENCOUNTER — Encounter (HOSPITAL_COMMUNITY): Payer: Self-pay | Admitting: Obstetrics & Gynecology

## 2022-12-16 ENCOUNTER — Other Ambulatory Visit: Payer: Self-pay

## 2022-12-16 DIAGNOSIS — O480 Post-term pregnancy: Secondary | ICD-10-CM

## 2022-12-16 DIAGNOSIS — Z3A40 40 weeks gestation of pregnancy: Secondary | ICD-10-CM

## 2022-12-16 DIAGNOSIS — O99344 Other mental disorders complicating childbirth: Secondary | ICD-10-CM

## 2022-12-16 MED ORDER — ZOLPIDEM TARTRATE 5 MG PO TABS: 5.0000 mg | ORAL_TABLET | Freq: Every evening | ORAL | Status: DC | PRN

## 2022-12-16 MED ORDER — DIBUCAINE (PERIANAL) 1 % EX OINT: 1.0000 | TOPICAL_OINTMENT | CUTANEOUS | Status: DC | PRN

## 2022-12-16 MED ORDER — PRENATAL MULTIVITAMIN CH
1.0000 | ORAL_TABLET | Freq: Every day | ORAL | Status: DC
  Administered 2022-12-16: 1 via ORAL
  Filled 2022-12-16: qty 1

## 2022-12-16 MED ORDER — ACETAMINOPHEN 325 MG PO TABS
650.0000 mg | ORAL_TABLET | ORAL | Status: DC | PRN
  Administered 2022-12-17: 650 mg via ORAL
  Filled 2022-12-16: qty 2

## 2022-12-16 MED ORDER — SIMETHICONE 80 MG PO CHEW: 80.0000 mg | CHEWABLE_TABLET | ORAL | Status: DC | PRN

## 2022-12-16 MED ORDER — DIPHENHYDRAMINE HCL 25 MG PO CAPS: 25.0000 mg | ORAL_CAPSULE | Freq: Four times a day (QID) | ORAL | Status: DC | PRN

## 2022-12-16 MED ORDER — METHYLERGONOVINE MALEATE 0.2 MG/ML IJ SOLN: 0.2000 mg | Freq: Once | INTRAMUSCULAR | Status: DC

## 2022-12-16 MED ORDER — TETANUS-DIPHTH-ACELL PERTUSSIS 5-2.5-18.5 LF-MCG/0.5 IM SUSY: 0.5000 mL | PREFILLED_SYRINGE | Freq: Once | INTRAMUSCULAR | Status: DC

## 2022-12-16 MED ORDER — BENZOCAINE-MENTHOL 20-0.5 % EX AERO
1.0000 | INHALATION_SPRAY | CUTANEOUS | Status: DC | PRN
  Administered 2022-12-16: 1 via TOPICAL
  Filled 2022-12-16: qty 56

## 2022-12-16 MED ORDER — WITCH HAZEL-GLYCERIN EX PADS: 1.0000 | MEDICATED_PAD | CUTANEOUS | Status: DC | PRN

## 2022-12-16 MED ORDER — IBUPROFEN 600 MG PO TABS
600.0000 mg | ORAL_TABLET | Freq: Four times a day (QID) | ORAL | Status: DC
  Administered 2022-12-16 – 2022-12-17 (×5): 600 mg via ORAL
  Filled 2022-12-16 (×5): qty 1

## 2022-12-16 MED ORDER — ONDANSETRON HCL 4 MG PO TABS: 4.0000 mg | ORAL_TABLET | ORAL | Status: DC | PRN

## 2022-12-16 MED ORDER — SENNOSIDES-DOCUSATE SODIUM 8.6-50 MG PO TABS
2.0000 | ORAL_TABLET | ORAL | Status: DC
  Administered 2022-12-16 – 2022-12-17 (×2): 2 via ORAL
  Filled 2022-12-16 (×2): qty 2

## 2022-12-16 MED ORDER — LIDOCAINE HCL (PF) 1 % IJ SOLN
INTRAMUSCULAR | Status: DC | PRN
  Administered 2022-12-16: 11 mL via EPIDURAL

## 2022-12-16 MED ORDER — COCONUT OIL OIL: 1.0000 | TOPICAL_OIL | Status: DC | PRN

## 2022-12-16 MED ORDER — METHYLERGONOVINE MALEATE 0.2 MG/ML IJ SOLN
INTRAMUSCULAR | Status: AC
  Administered 2022-12-16: 0.2 mg
  Filled 2022-12-16: qty 1

## 2022-12-16 MED ORDER — ONDANSETRON HCL 4 MG/2ML IJ SOLN: 4.0000 mg | INTRAMUSCULAR | Status: DC | PRN

## 2022-12-16 NOTE — Discharge Summary (Signed)
Postpartum Discharge Summary  Patient Name: Valerie Erickson DOB: 2000-10-15 MRN: [id number]  Date of admission: 12/15/2022 Delivery date:12/16/2022  Delivering provider: Celedonio Savage  Date of discharge: 12/17/2022  Admitting diagnosis: [redacted] weeks gestation of pregnancy [O48.0, Z3A.41] Intrauterine pregnancy: [redacted]w[redacted]d     Secondary diagnosis:  Principal Problem:   Vaginal delivery Active Problems:   Anxiety and depression   Immigrant with language difficulty   Generalized anxiety disorder   Supervision of other normal pregnancy, antepartum   [redacted] weeks gestation of pregnancy  Additional problems: anemia    Discharge diagnosis: Term Pregnancy Delivered                                              Post partum procedures: none Augmentation: AROM, Pitocin, and IP Foley Complications: None  Hospital course: Induction of Labor With Vaginal Delivery   22 y.o. yo [id number] at [redacted]w[redacted]d was admitted to the hospital 12/15/2022 for induction of labor.  Indication for induction: Postdates.  Patient had an labor course complicated by more than expected blood loss post delivery. Post partum CBC 10.0. Patient given PO iron sulfate.  Membrane Rupture Time/Date: 5:15 AM ,12/16/2022   Delivery Method:Vaginal, Spontaneous  Episiotomy: None  Lacerations:  2nd degree  Details of delivery can be found in separate delivery note.  Patient had a postpartum course complicated by mild anemia, for which she was started on PO iron and will continue on that at discharge. Patient is discharged home 12/17/22.  Newborn Data: Birth date:12/16/2022  Birth time:9:03 AM  Gender:Female  Living status:Living  Apgars:9 ,9  Weight:3410 g   Magnesium Sulfate received: No BMZ received: No Rhophylac:N/A MMR:N/A T-DaP: Ordered postpartum  Flu: N/A Transfusion:No  Physical exam   Please see exam findings in progress note from 12/17/2022 by Dr Camelia PhenesRobb Matar.  Vitals:   12/16/22 2332 12/17/22 0500 12/17/22 0810  12/17/22 1302  BP: (!) 97/56 (!) 97/59  103/69  Pulse: 96 94  95  Resp: 18 16    Temp: 98 F (36.7 C)  97.9 F (36.6 C) 98.1 F (36.7 C)  TempSrc: Oral  Oral Oral  SpO2: 98% 98%  99%  Weight:      Height:        Labs: Lab Results  Component Value Date   WBC 14.9 (H) 12/17/2022   HGB 10.0 (L) 12/17/2022   HCT 28.5 (L) 12/17/2022   MCV 91.3 12/17/2022   PLT 188 12/17/2022      Latest Ref Rng & Units 01/24/2022    8:24 PM  CMP  Glucose 70 - 99 mg/dL 98   BUN 6 - 20 mg/dL 12   Creatinine 0.27 - 1.00 mg/dL 2.53   Sodium 664 - 403 mmol/L 135   Potassium 3.5 - 5.1 mmol/L 3.7   Chloride 98 - 111 mmol/L 108   CO2 22 - 32 mmol/L 22   Calcium 8.9 - 10.3 mg/dL 8.6   Total Protein 6.5 - 8.1 g/dL 6.3   Total Bilirubin 0.3 - 1.2 mg/dL 0.7   Alkaline Phos 38 - 126 U/L 61   AST 15 - 41 U/L 24   ALT 0 - 44 U/L 43    Edinburgh Score:    12/16/2022    5:19 PM  Edinburgh Postnatal Depression Scale Screening Tool  I have been able to laugh and see the funny side of  things. 1  I have looked forward with enjoyment to things. 1  I have blamed myself unnecessarily when things went wrong. 2  I have been anxious or worried for no good reason. 2  I have felt scared or panicky for no good reason. 1  Things have been getting on top of me. 1  I have been so unhappy that I have had difficulty sleeping. 2  I have felt sad or miserable. 2  I have been so unhappy that I have been crying. 0  The thought of harming myself has occurred to me. 0  Edinburgh Postnatal Depression Scale Total 12     After visit meds:  Allergies as of 12/17/2022   No Known Allergies      Medication List     STOP taking these medications    folic acid 400 MCG tablet Commonly known as: FOLVITE       TAKE these medications    ferrous sulfate 325 (65 FE) MG tablet Take 1 tablet (325 mg total) by mouth every other day. Start taking on: December 19, 2022   PNV Prenatal Plus Multivitamin 27-1 MG Tabs Take 1  tablet by mouth daily before breakfast.   senna-docusate 8.6-50 MG tablet Commonly known as: Senokot-S Take 2 tablets by mouth daily as needed for mild constipation.   sertraline 50 MG tablet Commonly known as: Zoloft Take 1 tablet (50 mg total) by mouth daily. What changed: Another medication with the same name was removed. Continue taking this medication, and follow the directions you see here.        Discharge home in stable condition Infant Feeding: Breast Infant Disposition:home with mother Discharge instruction: per After Visit Summary and Postpartum booklet. Activity: Advance as tolerated. Pelvic rest for 6 weeks.  Diet: routine diet Future Appointments: Future Appointments  Date Time Provider Department Center  01/13/2023  3:50 PM Warden Fillers, MD [facility] None   Follow up Visit: Message sent to office to make appointment for behavioral health check, given elevated EPDS.  Please schedule this patient for a In person postpartum visit in 4 weeks with the following provider: Any provider. Additional Postpartum F/U: None   Low risk pregnancy complicated by:  None Delivery mode:  Vaginal, Spontaneous  Anticipated Birth Control:  Unsure  Sheppard Evens MD MPH OB Fellow, Faculty Practice [facility], Center for [facility] 12/17/2022

## 2022-12-16 NOTE — Anesthesia Procedure Notes (Addendum)
Epidural Patient location during procedure: OB Start time: 12/15/2022 11:52 PM End time: 12/16/2022 12:15 AM  Staffing Anesthesiologist: Lowella Curb, MD Performed: anesthesiologist   Preanesthetic Checklist Completed: patient identified, IV checked, site marked, risks and benefits discussed, surgical consent, monitors and equipment checked, pre-op evaluation and timeout performed  Epidural Patient position: sitting Prep: ChloraPrep Patient monitoring: heart rate, cardiac monitor, continuous pulse ox and blood pressure Approach: midline Location: L2-L3 Injection technique: LOR saline  Needle:  Needle type: Tuohy  Needle gauge: 17 G Needle length: 9 cm Needle insertion depth: 6 cm Catheter type: closed end flexible Catheter size: 20 Guage Catheter at skin depth: 10 cm Test dose: negative  Assessment Events: blood not aspirated, injection not painful, no injection resistance, no paresthesia and negative IV test  Additional Notes Reason for block:procedure for pain

## 2022-12-16 NOTE — Lactation Note (Signed)
This note was copied from a baby's chart. Lactation Consultation Note  Patient Name: Valerie Erickson [id number] Date: 12/16/2022 Age:22 hours Reason for consult: Initial assessment;Primapara;Term Assisted baby to the breast in cradle position after baby had bath. RN Erie Noe is mom's Environmental education officer and interpret for [facility]. Mom denies painful latch. LC flanged lips because they didn't look flanged wide enough. Mom stated better Newborn feeding habits, behavior, STS, I&O reviewed. Mom encouraged to feed baby 8-12 times/24 hours and with feeding cues.  Answered mom's questions. Encouraged to call for assistance as needed.  Maternal Data Has patient been taught Hand Expression?: Yes Does the patient have breastfeeding experience prior to this delivery?: No  Feeding    LATCH Score Latch: Grasps breast easily, tongue down, lips flanged, rhythmical sucking.  Audible Swallowing: A few with stimulation  Type of Nipple: Everted at rest and after stimulation  Comfort (Breast/Nipple): Soft / non-tender  Hold (Positioning): Assistance needed to correctly position infant at breast and maintain latch.  LATCH Score: 8   Lactation Tools Discussed/Used    Interventions Interventions: Breast feeding basics reviewed;Adjust position;Assisted with latch;Support pillows;Skin to skin;Position options;Breast massage;Hand express;LC Services brochure;Breast compression  Discharge    Consult Status Consult Status: Follow-up Date: 12/17/22 Follow-up type: In-patient    Charyl Dancer 12/16/2022, 10:09 PM

## 2022-12-16 NOTE — Progress Notes (Signed)
Labor Progress Note  Valerie Erickson is a 22 y.o. G3P0020 at [redacted]w[redacted]d presented for SOL and IOL PD  S: pt doing well s/p epidural.  On 7 milliunits of Pitocin  O:  BP (!) 94/52   Pulse 64   Temp 98 F (36.7 C) (Oral)   Resp 16   Ht 5\' 4"  (1.626 m)   Wt 94.3 kg   LMP 04/22/2022   SpO2 99%   BMI 35.70 kg/m  EFM:140 bpm/Moderate variability/ 15x15 accels/ None decels CAT: 1 Toco: regular, every 2-3 minutes   CVE: Dilation: 8.5 Effacement (%): 90 Cervical Position: Anterior Station: -1 Presentation: Vertex Exam by:: Brynda Greathouse, RN   A&P: 22 y.o. [id number] [redacted]w[redacted]d  here for IOl as above  #Labor: Progressing well. Continue pitocin 1x1.  Declines AROM at this time #Pain: Family/Friend support and Epidural  #FWB: CAT 1 #GBS negative  Myrtie Hawk, DO FMOB Fellow, Faculty practice [facility], Center for [facility] 12/16/22  1:24 AM

## 2022-12-16 NOTE — Progress Notes (Signed)
Labor Progress Note  Ayane Binah Abella is a 22 y.o. G3P0020 at [redacted]w[redacted]d presented for SOL and IOL PD  S: pt doing well. No feeling of pressure  O:  BP 112/69   Pulse 81   Temp 98.2 F (36.8 C) (Oral)   Resp 16   Ht 5\' 4"  (1.626 m)   Wt 94.3 kg   LMP 04/22/2022   SpO2 99%   BMI 35.70 kg/m  EFM:140 bpm/Moderate variability/ 15x15 accels/ None decels CAT: 1 Toco: regular, every 2-3 minutes   CVE: Dilation: 10 Dilation Complete Date: 12/16/22 Dilation Complete Time: 0515 Effacement (%): 100 Cervical Position: Anterior Station: Plus 1 Presentation: Vertex Exam by:: Dr. Lanae Crumbly   A&P: 22 y.o. [id number] [redacted]w[redacted]d  here for IOl as above  #Labor: Progressing well. AROM clear. Will push when pt feels urge. Anticipate SVD.  #Pain: Family/Friend support and Epidural  #FWB: CAT 1 #GBS negative  Myrtie Hawk, DO FMOB Fellow, Faculty practice [facility], Center for [facility] 12/16/22  5:40 AM

## 2022-12-16 NOTE — Anesthesia Postprocedure Evaluation (Signed)
Anesthesia Post Note  Patient: Olena Leatherwood  Procedure(s) Performed: AN AD HOC LABOR EPIDURAL     Patient location during evaluation: Mother Baby Anesthesia Type: Epidural Level of consciousness: awake and alert Pain management: pain level controlled Vital Signs Assessment: post-procedure vital signs reviewed and stable Respiratory status: spontaneous breathing, nonlabored ventilation and respiratory function stable Cardiovascular status: stable Postop Assessment: no headache, no backache and epidural receding Anesthetic complications: no   No notable events documented.  Last Vitals:  Vitals:   12/16/22 1314 12/16/22 1719  BP: 107/64 (!) 109/55  Pulse: 100 90  Resp: 20 18  Temp: 37.1 C 36.9 C  SpO2: 99% 100%    Last Pain:  Vitals:   12/16/22 1719  TempSrc: Oral  PainSc: 0-No pain   Pain Goal: Patients Stated Pain Goal: Other (Comment) (Pt states does not want anything for pain.) (12/15/22 0710)                 Marrion Coy

## 2022-12-17 LAB — CBC
HCT: 28.5 % — ABNORMAL LOW (ref 36.0–46.0)
Hemoglobin: 10 g/dL — ABNORMAL LOW (ref 12.0–15.0)
MCH: 32.1 pg (ref 26.0–34.0)
MCHC: 35.1 g/dL (ref 30.0–36.0)
MCV: 91.3 fL (ref 80.0–100.0)
Platelets: 188 10*3/uL (ref 150–400)
RBC: 3.12 MIL/uL — ABNORMAL LOW (ref 3.87–5.11)
RDW: 13.5 % (ref 11.5–15.5)
WBC: 14.9 10*3/uL — ABNORMAL HIGH (ref 4.0–10.5)
nRBC: 0 % (ref 0.0–0.2)

## 2022-12-17 LAB — BIRTH TISSUE RECOVERY COLLECTION (PLACENTA DONATION)

## 2022-12-17 MED ORDER — SENNOSIDES-DOCUSATE SODIUM 8.6-50 MG PO TABS: 2.0000 | ORAL_TABLET | Freq: Every day | ORAL | 0 refills | Status: AC | PRN

## 2022-12-17 MED ORDER — FERROUS SULFATE 325 (65 FE) MG PO TABS
325.0000 mg | ORAL_TABLET | ORAL | Status: DC
  Administered 2022-12-17: 325 mg via ORAL
  Filled 2022-12-17: qty 1

## 2022-12-17 MED ORDER — FERROUS SULFATE 325 (65 FE) MG PO TABS: 325.0000 mg | ORAL_TABLET | ORAL | 0 refills | Status: AC

## 2022-12-17 NOTE — Progress Notes (Signed)
CSW received consult for hx of Depression and edinburgh score 12.  CSW met with MOB to offer support and complete assessment, FOB present.  CSW utilized [employer] language interpreters spanish video interpreter (Alejandro #[id number]). CSW introduced self and asked to speak with MOB privately, FOB left the room. CSW explained reason for consult. MOB was welcoming, pleasant, and remained engaged during assessment. CSW and MOB discussed MOB's mental health history. MOB reported that she was diagnosed with depression 4 months ago. MOB described her depression as crying all day and wanting to stay at home all day alone. MOB reported that her symptoms are improving. MOB reported that she is not taking any medication and shared that she is participating in therapy. MOB reported that she does virtual therapy every 8 days and was unable to recall the name of her therapist. MOB reported that therapy is helpful and she is learning exercises and ideas to be more positive. MOB reported that she plans to continue therapy. CSW acknowledged elevated edinburgh score 12. MOB denied any recent stressors and reported that she has been feeling good over the past seven days, noting she hasn't been upset of frustrated. CSW inquired about how MOB was feeling emotionally since giving birth, MOB reported that she was feeling good. MOB presented calm and did not demonstrate any acute mental health signs/symptoms. CSW assessed for safety, MOB denied SI, HI, and domestic violence. CSW inquired about MOB's support system, MOB reported that her partner is a support. CSW inquired about financial stressors mentioned in MOB's chart, MOB denied any current financial stressors. CSW asked would any resources/supports be helpful, MOB reported none. MOB reported that she applied for emergency medicaid and spoke with a financial counselor but hadn't heard anything. CSW explained that it's a 45 day process, MOB verbalized understanding and denied any  additional questions. CSW informed MOB that she may be more susceptible to postpartum depression due to her mental health history and elevated edinburgh score.   CSW provided education regarding the baby blues period vs. perinatal mood disorders, discussed treatment and gave resources for mental health follow up if concerns arise.  CSW recommends self-evaluation during the postpartum time period using the New Mom Checklist from Postpartum Progress and encouraged MOB to contact a medical professional if symptoms are noted at any time.    CSW provided review of Sudden Infant Death Syndrome (SIDS) precautions. MOB verbalized understanding and reported having all items needed to care for infant including a car seat and crib.   CSW identifies no further need for intervention and no barriers to discharge at this time.  Soni Kegel, LCSW Clinical Social Worker [facility] Cell#: [phone]   

## 2022-12-17 NOTE — Progress Notes (Signed)
POSTPARTUM PROGRESS NOTE  Post Partum Day 1  Subjective:  Valerie Erickson is a 22 y.o. [id number] s/p VD at [redacted]w[redacted]d.  She reports she is doing well. No acute events overnight. She denies any problems with ambulating, voiding or po intake. Denies nausea or vomiting.  Pain is well controlled.  Lochia is adequate.  Objective: Blood pressure (!) 97/59, pulse 94, temperature 98 F (36.7 C), temperature source Oral, resp. rate 16, height 5\' 4"  (1.626 m), weight 94.3 kg, last menstrual period 04/22/2022, SpO2 98 %, unknown if currently breastfeeding.  Physical Exam:  General: alert, cooperative and no distress Chest: no respiratory distress Heart:regular rate, distal pulses intact Uterine Fundus: firm, appropriately tender DVT Evaluation: No calf swelling or tenderness Extremities: no edema Skin: warm, dry  Recent Labs    12/15/22 0858 12/17/22 0541  HGB 14.0 10.0*  HCT 40.6 28.5*    Assessment/Plan: Valerie Erickson is a 22 y.o. [id number] s/p VD at [redacted]w[redacted]d   PPD#1 - Doing well  Routine postpartum care Anemia: Asx. Given PO ferrous sulfate Contraception: Unsure Feeding: Breast Dispo: Plan for discharge tomorrow.   LOS: 2 days   Myrtie Hawk, DO OB Fellow  12/17/2022, 8:07 AM

## 2022-12-17 NOTE — Lactation Note (Signed)
This note was copied from a baby's chart. Lactation Consultation Note  Patient Name: Girl Mickel Hamideh [id number] Date: 12/17/2022 Age:22 hours  Reason for consult: Follow-up assessment;Primapara;1st time breastfeeding;Term  P1, [redacted]w[redacted]d, 3% weight loss  Mother speaks Spanish and when [facility] went to get video interpreter service , mother requested to have family interpreter. Baby was being held on mother's chest.   Mother denies any concerns related to breastfeeding other than "not having enough milk yet for her baby". She states "[employer] trader well. Mother is supplementing with formula by choice. Discussed supply and demand, how milk production is initiated and established with frequent feedings at the breast or pumping. Mother verbalized understanding. Encouraged to breastfeed 8-12 plus/24 hours and if desired, supplement after breastfeeding.   Mom made aware of O/P services, breastfeeding support groups, and our phone # for post-discharge questions.     Feeding Mother's Current Feeding Choice: Breast Milk and Formula    Interventions Interventions: Education  Discharge Discharge Education: Engorgement and breast care;Warning signs for feeding baby;Other (comment) (informed of OP LC services) Pump: DEBP;Personal  Consult Status Consult Status: Complete Date: 12/17/22    Omar Person 12/17/2022, 4:09 PM

## 2022-12-19 ENCOUNTER — Inpatient Hospital Stay (HOSPITAL_COMMUNITY): Payer: Self-pay

## 2022-12-19 ENCOUNTER — Inpatient Hospital Stay (HOSPITAL_COMMUNITY): Admission: AD | Admit: 2022-12-19 | Payer: Self-pay | Source: Home / Self Care | Admitting: Obstetrics & Gynecology

## 2022-12-25 ENCOUNTER — Institutional Professional Consult (permissible substitution): Payer: Self-pay | Admitting: Licensed Clinical Social Worker

## 2023-01-07 ENCOUNTER — Telehealth (HOSPITAL_COMMUNITY): Payer: Self-pay

## 2023-01-07 NOTE — Telephone Encounter (Signed)
01/07/2023 1605  Name: Valerie Erickson MRN: [id number] DOB: 10-19-2000  Reason for Call:  Transition of Care Hospital Discharge Call  Contact Status: Patient Contact Status: Complete  Language assistant needed: Interpreter Mode: Telephonic Interpreter Interpreter Name: [phone] Interpreter Phone Number - If applicable: [phone]        Follow-Up Questions: Do You Have Any Concerns About Your Health As You Heal From Delivery?: Yes What Concerns Do You Have About Your Health?: Patient states that she is doing well. She states that she has some pain in her private area. She states that the soreness has improved since delivery.  RN reviewed healing process of perineum and perineum care. RN also told patient to call her OB if she has increased pain, foul vaginal odor or discharge, or fever. Patient declines any other questions or concerns about her health or healing. Do You Have Any Concerns About Your Infants Health?: No  Edinburgh Postnatal Depression Scale:  In the Past 7 Days: I have been able to laugh and see the funny side of things.: As much as I always could I have looked forward with enjoyment to things.: As much as I ever did I have blamed myself unnecessarily when things went wrong.: Not very often I have been anxious or worried for no good reason.: Hardly ever I have felt scared or panicky for no good reason.: No, not at all Things have been getting on top of me.: No, I have been coping as well as ever I have been so unhappy that I have had difficulty sleeping.: Not very often I have felt sad or miserable.: No, not at all I have been so unhappy that I have been crying.: Only occasionally The thought of harming myself has occurred to me.: Never Edinburgh Postnatal Depression Scale Total: 4  PHQ2-9 Depression Scale:     Discharge Follow-up: Edinburgh score requires follow up?: No  Post-discharge interventions: Reviewed Newborn Safe Sleep  Practices  Signature  Signe Colt

## 2023-01-13 ENCOUNTER — Ambulatory Visit (INDEPENDENT_AMBULATORY_CARE_PROVIDER_SITE_OTHER): Payer: Self-pay | Admitting: Obstetrics and Gynecology

## 2023-01-13 NOTE — Progress Notes (Signed)
Post Partum Visit Note  Valerie Erickson is a 22 y.o. G42P1021 female who presents for a postpartum visit. She is 4 weeks postpartum following a normal spontaneous vaginal delivery.  I have fully reviewed the prenatal and intrapartum course. The delivery was at 40.4 gestational weeks.  Anesthesia: epidural. Postpartum course has been uncomplicated. Baby is doing well. Baby is feeding by breast. Bleeding no bleeding. Bowel function is normal. Bladder function is normal. Patient is not sexually active. Contraception method is oral progesterone-only contraceptive.   Postpartum depression screening: negative, score 1.   The pregnancy intention screening data noted above was reviewed. Potential methods of contraception were discussed. The patient elected to proceed with No data recorded.   Edinburgh Postnatal Depression Scale - 01/13/23 1611       Edinburgh Postnatal Depression Scale:  In the Past 7 Days   I have been able to laugh and see the funny side of things. 0    I have looked forward with enjoyment to things. 0    I have blamed myself unnecessarily when things went wrong. 1    I have been anxious or worried for no good reason. 0    I have felt scared or panicky for no good reason. 0    Things have been getting on top of me. 0    I have been so unhappy that I have had difficulty sleeping. 0    I have felt sad or miserable. 0    I have been so unhappy that I have been crying. 0    The thought of harming myself has occurred to me. 0    Edinburgh Postnatal Depression Scale Total 1             Health Maintenance Due  Topic Date Due   HPV VACCINES (1 - 3-dose series) Never done   DTaP/Tdap/Td (1 - Tdap) Never done   COVID-19 Vaccine (1 - 2023-24 season) Never done    The following portions of the patient's history were reviewed and updated as appropriate: allergies, current medications, past family history, past medical history, past social history, past surgical history,  and problem list.  Review of Systems Pertinent items are noted in HPI.  Objective:  BP 102/68   Pulse 76   Wt 192 lb (87.1 kg)   LMP 04/22/2022   Breastfeeding Yes   BMI 32.96 kg/m    General:  alert, cooperative, and no distress   Breasts:  not indicated  Lungs: clear to auscultation bilaterally  Heart:  regular rate and rhythm  Abdomen: soft, non-tender; bowel sounds normal; no masses,  no organomegaly   Wound N/a  GU exam:  not indicated       Assessment:    1. Encounter for routine postpartum follow-up Samples of slynd given to patient.  Pt is adopt a mom, she is advised to follow up with health department to get OCP from there, she desires pops   Normal postpartum exam.   Plan:   Essential components of care per ACOG recommendations:  1.  Mood and well being: Patient with negative depression screening today. Reviewed local resources for support.  - Patient tobacco use? No.   - hx of drug use? No.    2. Infant care and feeding:  -Patient currently breastmilk feeding? Yes. Reviewed importance of draining breast regularly to support lactation.  -Social determinants of health (SDOH) reviewed in EPIC. No concerns.  3. Sexuality, contraception and birth spacing - Patient does  not want a pregnancy in the next year.  Desired family size is 2 children.  - Reviewed reproductive life planning. Reviewed contraceptive methods based on pt preferences and effectiveness.  Patient desired Oral Contraceptive today.   - Discussed birth spacing of 18 months  4. Sleep and fatigue -Encouraged family/partner/community support of 4 hrs of uninterrupted sleep to help with mood and fatigue  5. Physical Recovery  - Discussed patients delivery and complications. She describes her labor as good. - Patient had a Vaginal, no problems at delivery. Patient had a 2nd degree laceration. Perineal healing reviewed. Patient expressed understanding - Patient has urinary incontinence? No. -  Patient is safe to resume physical and sexual activity  6.  Health Maintenance - HM due items addressed Yes - Last pap smear  Diagnosis  Date Value Ref Range Status  06/30/2022 (A)  Final   - Atypical squamous cells of undetermined significance (ASC-US)   Pap smear not done at today's visit.  -Breast Cancer screening indicated? No.   7. Chronic Disease/Pregnancy Condition follow up: None  - PCP follow up  Warden Fillers, MD [facility], [facility]

## 2023-02-12 ENCOUNTER — Other Ambulatory Visit: Payer: Self-pay | Admitting: Family Medicine

## 2023-07-08 ENCOUNTER — Inpatient Hospital Stay (HOSPITAL_COMMUNITY): Payer: Self-pay

## 2023-07-08 ENCOUNTER — Ambulatory Visit (HOSPITAL_COMMUNITY)
Admission: EM | Admit: 2023-07-08 | Discharge: 2023-07-08 | Disposition: A | Discharge status: Home / Self Care | Payer: Self-pay | Attending: Internal Medicine | Admitting: Internal Medicine

## 2023-07-08 ENCOUNTER — Encounter (HOSPITAL_COMMUNITY): Payer: Self-pay

## 2023-07-08 ENCOUNTER — Encounter (HOSPITAL_COMMUNITY): Payer: Self-pay | Admitting: Obstetrics and Gynecology

## 2023-07-08 ENCOUNTER — Inpatient Hospital Stay (HOSPITAL_COMMUNITY)
Admission: AD | Admit: 2023-07-08 | Discharge: 2023-07-09 | Discharge status: Against Medical Advice | Payer: Self-pay | Attending: Obstetrics and Gynecology | Admitting: Obstetrics and Gynecology

## 2023-07-08 DIAGNOSIS — O4690 Antepartum hemorrhage, unspecified, unspecified trimester: Secondary | ICD-10-CM | POA: Insufficient documentation

## 2023-07-08 DIAGNOSIS — N854 Malposition of uterus: Secondary | ICD-10-CM | POA: Insufficient documentation

## 2023-07-08 DIAGNOSIS — O209 Hemorrhage in early pregnancy, unspecified: Secondary | ICD-10-CM

## 2023-07-08 DIAGNOSIS — Z3A00 Weeks of gestation of pregnancy not specified: Secondary | ICD-10-CM | POA: Insufficient documentation

## 2023-07-08 DIAGNOSIS — O26899 Other specified pregnancy related conditions, unspecified trimester: Secondary | ICD-10-CM | POA: Insufficient documentation

## 2023-07-08 DIAGNOSIS — O3680X0 Pregnancy with inconclusive fetal viability, not applicable or unspecified: Secondary | ICD-10-CM | POA: Insufficient documentation

## 2023-07-08 LAB — WET PREP, GENITAL
Sperm: NONE SEEN
Trich, Wet Prep: NONE SEEN
WBC, Wet Prep HPF POC: <10 (ref <10)
Yeast Wet Prep HPF POC: NONE SEEN

## 2023-07-08 LAB — CBC
HCT: 43.6 % (ref 36.0–46.0)
Hemoglobin: 15.4 g/dL — ABNORMAL HIGH (ref 12.0–15.0)
MCH: 32.1 pg (ref 26.0–34.0)
MCHC: 35.3 g/dL (ref 30.0–36.0)
MCV: 90.8 fL (ref 80.0–100.0)
Platelets: 283 10*3/uL (ref 150–400)
RBC: 4.8 MIL/uL (ref 3.87–5.11)
RDW: 12.4 % (ref 11.5–15.5)
WBC: 12.5 10*3/uL — ABNORMAL HIGH (ref 4.0–10.5)
nRBC: 0 % (ref 0.0–0.2)

## 2023-07-08 LAB — POCT URINALYSIS DIP (MANUAL ENTRY)
Glucose, UA: NEGATIVE mg/dL
Leukocytes, UA: NEGATIVE
Nitrite, UA: NEGATIVE
Protein Ur, POC: 100 mg/dL — AB
Spec Grav, UA: 1.025 (ref 1.010–1.025)
Urobilinogen, UA: 0.2 U/dL
pH, UA: 5.5 (ref 5.0–8.0)

## 2023-07-08 LAB — HCG, QUANTITATIVE, PREGNANCY: hCG, Beta Chain, Quant, S: 14041 m[IU]/mL — ABNORMAL HIGH (ref <5)

## 2023-07-08 LAB — POCT URINE PREGNANCY: Preg Test, Ur: POSITIVE — AB

## 2023-07-08 LAB — HIV ANTIBODY (ROUTINE TESTING W REFLEX): HIV Screen 4th Generation wRfx: NONREACTIVE

## 2023-07-08 NOTE — ED Provider Notes (Signed)
 [facility]    CSN: [id number] Arrival date & time: 07/08/23  1759      History   Chief Complaint Chief Complaint  Patient presents with   Abdominal Pain    HPI Valerie Erickson is a 23 y.o. female comes to the urgent care for a history of vaginal bleeding.  Patient had a baby in 2024.  Patient has been exclusively breast-feeding.  She has not had a menstrual period since delivery throat 3 days ago.  She had some spotting like normal menstrual period until this morning when she started experiencing severe cramping abdominal pain and increased vaginal bleeding.  She has changed her pad about 7 times today and has been passing blood clots.  No nausea or vomiting.  No fever or chills.  Patient is sexually active.  She is married and in a monogamous relationship. HPI  Past Medical History:  Diagnosis Date   Depression    has sad days   H/O one miscarriage    Headache    Heart murmur    when in [place]   Ovarian cyst     Patient Active Problem List   Diagnosis Date Noted   Vaginal delivery 12/16/2022   [redacted] weeks gestation of pregnancy 12/15/2022   Positive screening for depression on 9-item Patient Health Questionnaire (PHQ-9) 09/22/2022   Abnormal Pap smear of cervix 07/28/2022   Supervision of other normal pregnancy, antepartum 06/30/2022   Generalized anxiety disorder 02/03/2021   Gastroesophageal reflux disease 02/03/2021   History of spontaneous abortion 12/02/2020   Anxiety and depression 12/02/2020   Immigrant with language difficulty 12/02/2020    Past Surgical History:  Procedure Laterality Date   DILATION AND CURETTAGE, DIAGNOSTIC / THERAPEUTIC  11/03/2020   FEMUR FRACTURE SURGERY Right 10/28/2020   KNEE SURGERY Right 10/31/2020   KNEE SURGERY      OB History     Gravida  4   Para  1   Term  1   Preterm  0   AB  2   Living  1      SAB  2   IAB  0   Ectopic  0   Multiple  0   Live Births  1            Home  Medications    Prior to Admission medications   Medication Sig Start Date End Date Taking? Authorizing Provider  ferrous sulfate  325 (65 FE) MG tablet Take 1 tablet (325 mg total) by mouth every other day. 12/19/22   Ndulue, Chiagoziem J, MD  Prenatal Vit-Fe Fumarate-FA (PNV PRENATAL PLUS MULTIVITAMIN) 27-1 MG TABS Take 1 tablet by mouth daily before breakfast. 06/30/22   Rudy Carlin LABOR, MD  senna-docusate (SENOKOT-S) 8.6-50 MG tablet Take 2 tablets by mouth daily as needed for mild constipation. 12/17/22   Ndulue, Chiagoziem J, MD  sertraline  (ZOLOFT ) 50 MG tablet Take 1 tablet (50 mg total) by mouth daily. Patient not taking: Reported on 10/06/2022 09/22/22   Leftwich-Kirby, Olam LABOR, CNM    Family History Family History  Problem Relation Age of Onset   Hyperlipidemia Mother    Kidney disease Father        unsure of specifics   Diabetes Father    Hypertension Father    Cirrhosis Father     Social History Social History   Tobacco Use   Smoking status: Never    Passive exposure: Never   Smokeless tobacco: Never  Vaping Use   Vaping  status: Never Used  Substance Use Topics   Alcohol use: Never   Drug use: Never     Allergies   Patient has no known allergies.   Review of Systems Review of Systems As per HPI  Physical Exam Triage Vital Signs ED Triage Vitals  Encounter Vitals Group     BP 07/08/23 1919 103/68     Systolic BP Percentile --      Diastolic BP Percentile --      Pulse Rate 07/08/23 1919 79     Resp 07/08/23 1919 16     Temp 07/08/23 1919 98.4 F (36.9 C)     Temp Source 07/08/23 1919 Oral     SpO2 07/08/23 1919 98 %     Weight 07/08/23 1921 200 lb (90.7 kg)     Height 07/08/23 1921 5' 6.14 (1.68 m)     Head Circumference --      Peak Flow --      Pain Score 07/08/23 1923 7     Pain Loc --      Pain Education --      Exclude from Growth Chart --    No data found.  Updated Vital Signs BP 103/68 (BP Location: Right Arm)   Pulse 79   Temp 98.4  F (36.9 C) (Oral)   Resp 16   Ht 5' 6.14 (1.68 m)   Wt 90.7 kg   LMP  (LMP Unknown)   SpO2 98%   Breastfeeding Yes   BMI 32.14 kg/m   Visual Acuity Right Eye Distance:   Left Eye Distance:   Bilateral Distance:    Right Eye Near:   Left Eye Near:    Bilateral Near:     Physical Exam Vitals and nursing note reviewed.  Constitutional:      General: She is in acute distress.     Appearance: She is not ill-appearing.  Cardiovascular:     Rate and Rhythm: Normal rate and regular rhythm.  Abdominal:     General: Abdomen is protuberant. Bowel sounds are normal. There is no distension.     Palpations: Abdomen is soft.     Tenderness: There is abdominal tenderness in the suprapubic area. There is no guarding or rebound.  Neurological:     Mental Status: She is alert.      UC Treatments / Results  Labs (all labs ordered are listed, but only abnormal results are displayed) Labs Reviewed  POCT URINALYSIS DIP (MANUAL ENTRY) - Abnormal; Notable for the following components:      Result Value   Color, UA red (*)    Clarity, UA cloudy (*)    Bilirubin, UA small (*)    Ketones, POC UA large (80) (*)    Blood, UA large (*)    Protein Ur, POC =100 (*)    All other components within normal limits  POCT URINE PREGNANCY - Abnormal; Notable for the following components:   Preg Test, Ur Positive (*)    All other components within normal limits    EKG   Radiology US  OB LESS THAN 14 WEEKS WITH OB TRANSVAGINAL Result Date: 07/08/2023 CLINICAL DATA:  Vaginal bleeding for 4 days. Beta HCG 14,000. Unknown LMP EXAM: OBSTETRIC <14 WK US  AND TRANSVAGINAL OB US  TECHNIQUE: Both transabdominal and transvaginal ultrasound examinations were performed for complete evaluation of the gestation as well as the maternal uterus, adnexal regions, and pelvic cul-de-sac. Transvaginal technique was performed to assess early pregnancy. COMPARISON:  None Available. FINDINGS:  Intrauterine gestational sac:  None Yolk sac:  Not Visualized. Embryo:  Not Visualized. Cardiac Activity: Not Visualized. Maternal uterus/adnexae: Normal adnexa. Retroverted uterus with thick heterogenous endometrium. No significant vascularity within the endometrium. No free fluid in the pelvis. IMPRESSION: No visualized intrauterine gestational sac. In the setting of positive pregnancy test, this reflects a pregnancy of unknown location. Differential considerations include early normal IUP, abnormal IUP, or nonvisualized ectopic pregnancy. Differentiation is achieved with serial beta HCG supplemented by repeat sonography as clinically warranted. Electronically Signed   By: Norman Gatlin M.D.   On: 07/08/2023 23:20    Procedures Procedures (including critical care time)  Medications Ordered in UC Medications - No data to display  Initial Impression / Assessment and Plan / UC Course  I have reviewed the triage vital signs and the nursing notes.  Pertinent labs & imaging results that were available during my care of the patient were reviewed by me and considered in my medical decision making (see chart for details).     1.  Vaginal bleeding in first trimester: Point-of-care urinalysis is positive for blood Urine pregnancy test is positive Patient is advised to go to the MAU for further evaluation. Final Clinical Impressions(s) / UC Diagnoses   Final diagnoses:  Vaginal bleeding in pregnancy, first trimester     Discharge Instructions      Please go to the MAU for further evaluation.     ED Prescriptions   None    PDMP not reviewed this encounter.   Blaise Aleene KIDD, MD 07/08/23 [phone]

## 2023-07-08 NOTE — MAU Note (Signed)
.  Valerie Erickson is a 22 y.o. at Unknown here in MAU reporting coming from main ED where she went due to lower abd pain and vag bleeding. VB for 4 days but heavier today. Reports using 7 pads. Pain for 3 days. No meds for pain.   LMP: unknown. First time bleeding after having a baby in June 2024 Onset of complaint: 4 days ago Pain score: 9 Vitals:   07/08/23 2055 07/08/23 2059  BP:  105/68  Pulse: 100   Resp: 17   Temp: 97.8 F (36.6 C)   SpO2: 99%      FHT:n/a Lab orders placed from triage: orders placed by provider

## 2023-07-08 NOTE — ED Notes (Signed)
 Patient is being discharged from the Urgent Care and sent to the MAU via self . Per provider, patient is in need of higher level of care due to vaginal bleeding in pregnancy. Patient is aware and verbalizes understanding of plan of care.  Vitals:   07/08/23 1919  BP: 103/68  Pulse: 79  Resp: 16  Temp: 98.4 F (36.9 C)  SpO2: 98%

## 2023-07-08 NOTE — Discharge Instructions (Signed)
 Please go to the MAU for further evaluation.

## 2023-07-08 NOTE — ED Triage Notes (Signed)
 Patient here today with c/o lower abd pain X 4 days. She started her LMP 4 days ago but she has noticed more blood today.

## 2023-07-09 ENCOUNTER — Other Ambulatory Visit: Payer: Self-pay | Admitting: Certified Nurse Midwife

## 2023-07-09 LAB — GC/CHLAMYDIA PROBE AMP (~~LOC~~) NOT AT ARMC
Chlamydia: NEGATIVE
Comment: NEGATIVE
Comment: NORMAL
Neisseria Gonorrhea: NEGATIVE

## 2023-07-09 LAB — RPR: RPR Ser Ql: NONREACTIVE

## 2023-07-09 NOTE — MAU Note (Signed)
.  Valerie Erickson is a 23 y.o. here in MAU,  Pt desires to leave Against Medical Advice. Patient verbalized understanding of provider's plan of care and voiced understanding of risks of leaving AMA. Patient states she would like to come back but needs to leave urgently due to child care.   AMA paperwork interpreted by this RN, patient voiced understanding, signed AMA paper and placed in paper chart.

## 2023-07-12 ENCOUNTER — Inpatient Hospital Stay (HOSPITAL_COMMUNITY)
Admission: AD | Admit: 2023-07-12 | Discharge: 2023-07-12 | Disposition: A | Discharge status: Home / Self Care | Payer: Self-pay | Attending: Obstetrics & Gynecology | Admitting: Obstetrics & Gynecology

## 2023-07-12 ENCOUNTER — Inpatient Hospital Stay (HOSPITAL_COMMUNITY): Payer: Self-pay

## 2023-07-12 DIAGNOSIS — O039 Complete or unspecified spontaneous abortion without complication: Secondary | ICD-10-CM | POA: Insufficient documentation

## 2023-07-12 DIAGNOSIS — Z3A00 Weeks of gestation of pregnancy not specified: Secondary | ICD-10-CM

## 2023-07-12 LAB — HCG, QUANTITATIVE, PREGNANCY: hCG, Beta Chain, Quant, S: 400 m[IU]/mL — ABNORMAL HIGH (ref <5)

## 2023-07-12 LAB — WET PREP, GENITAL
Clue Cells Wet Prep HPF POC: NONE SEEN
Sperm: NONE SEEN
Trich, Wet Prep: NONE SEEN
WBC, Wet Prep HPF POC: >=10 — AB (ref <10)
Yeast Wet Prep HPF POC: NONE SEEN

## 2023-07-12 LAB — CBC
HCT: 40.8 % (ref 36.0–46.0)
Hemoglobin: 14.3 g/dL (ref 12.0–15.0)
MCH: 32.1 pg (ref 26.0–34.0)
MCHC: 35 g/dL (ref 30.0–36.0)
MCV: 91.7 fL (ref 80.0–100.0)
Platelets: 256 10*3/uL (ref 150–400)
RBC: 4.45 MIL/uL (ref 3.87–5.11)
RDW: 12.3 % (ref 11.5–15.5)
WBC: 6.6 10*3/uL (ref 4.0–10.5)
nRBC: 0 % (ref 0.0–0.2)

## 2023-07-12 NOTE — Discharge Instructions (Signed)

## 2023-07-12 NOTE — MAU Provider Note (Signed)
 VB    S [name] is a 23 y.o. [phone] pregnant female at Unknown who presents to MAU today with complaint of VB. Of note was seen 1/10 for similar complaint but left AMA 2/2 child care issues.  She states the bleeding has been going on for 4 days but heavier today.  Used 7 pads today and soaked through pad in less than one hour, pain for 3 days, no meds for the pain. PUL on US  1/9. O+ blood type.    Pertinent items noted in HPI and remainder of comprehensive ROS otherwise negative.   O BP 109/62 (BP Location: Right Arm)   Pulse 92   Temp 98.3 F (36.8 C) (Oral)   Resp 14   Wt 96.2 kg   LMP  (LMP Unknown)   SpO2 96%   BMI 34.22 kg/m  Physical Exam Vitals (interpreter used throughout encounter) and nursing note reviewed. Exam conducted with a chaperone present.  Constitutional:      General: She is not in acute distress.    Appearance: Normal appearance. She is obese. She is not ill-appearing.  HENT:     Head: Normocephalic and atraumatic.     Right Ear: External ear normal.     Left Ear: External ear normal.     Nose: Nose normal.     Mouth/Throat:     Mouth: Mucous membranes are moist.     Pharynx: Oropharynx is clear.  Eyes:     Extraocular Movements: Extraocular movements intact.     Conjunctiva/sclera: Conjunctivae normal.  Cardiovascular:     Rate and Rhythm: Normal rate.  Pulmonary:     Effort: Pulmonary effort is normal. No respiratory distress.  Abdominal:     General: Abdomen is flat.     Palpations: Abdomen is soft.     Tenderness: There is no abdominal tenderness.  Genitourinary:    General: Normal vulva.     Comments: Cervical os with what appears to be products of conception with no active bleeding, able to be removed with ring forceps, sent for pathology Musculoskeletal:        General: No swelling. Normal range of motion.     Cervical back: Normal range of motion.  Skin:    General: Skin is warm and dry.  Neurological:      Mental Status: She is alert and oriented to person, place, and time. Mental status is at baseline.     Motor: No weakness.     Gait: Gait normal.  Psychiatric:        Mood and Affect: Mood normal.        Behavior: Behavior normal.     MDM: MAU Course:  Wet prep negative Hgb 14.3 hCG 14k > 400  No products of conception remain on US .   A/P #Complete miscarriage  - f/u pathology report - f/u GC  Discharge from MAU in stable condition with strict/usual precautions Follow up at desired OBGYN as scheduled for ongoing women's health care  Allergies as of 07/12/2023   No Known Allergies      Medication List     TAKE these medications    ferrous sulfate  325 (65 FE) MG tablet Take 1 tablet (325 mg total) by mouth every other day.   PNV Prenatal Plus Multivitamin 27-1 MG Tabs Take 1 tablet by mouth daily before breakfast.   senna-docusate 8.6-50 MG tablet Commonly known as: Senokot-S Take 2 tablets by mouth daily as needed for mild constipation.  sertraline  50 MG tablet Commonly known as: Zoloft  Take 1 tablet (50 mg total) by mouth daily.        Jhonny Augustin BROCKS, MD 07/12/2023 2:01 PM

## 2023-07-12 NOTE — MAU Note (Signed)
..  Valerie Erickson is a 23 y.o. at Unknown here in MAU reporting: was here on Thursday for abdominal pain and vaginal bleeding and had to leave AMA. States she has continued to bleed and is soaking more than 1 maxi pad an hour. Also reporting lower abdominal cramping.  Pain score: 6 Vitals:   07/12/23 1045  BP: 109/62  Pulse: 92  Resp: 14  Temp: 98.3 F (36.8 C)  SpO2: 96%      Lab orders placed from triage:   none

## 2023-07-13 LAB — GC/CHLAMYDIA PROBE AMP (~~LOC~~) NOT AT ARMC
Chlamydia: NEGATIVE
Comment: NEGATIVE
Comment: NORMAL
Neisseria Gonorrhea: NEGATIVE
